# Patient Record
Sex: Female | Born: 1950 | Race: White | Hispanic: No | Marital: Married | Smoking: Never smoker
Health system: Southern US, Community
[De-identification: ages and names within clinical notes are randomized; demographics above are authoritative.]

## PROBLEM LIST (undated history)

## (undated) DIAGNOSIS — M8580 Other specified disorders of bone density and structure, unspecified site: Secondary | ICD-10-CM

## (undated) DIAGNOSIS — K589 Irritable bowel syndrome without diarrhea: Secondary | ICD-10-CM

## (undated) DIAGNOSIS — R87619 Unspecified abnormal cytological findings in specimens from cervix uteri: Secondary | ICD-10-CM

## (undated) HISTORY — DX: Irritable bowel syndrome, unspecified: K58.9

## (undated) HISTORY — DX: Unspecified abnormal cytological findings in specimens from cervix uteri: R87.619

## (undated) HISTORY — DX: Other specified disorders of bone density and structure, unspecified site: M85.80

## (undated) HISTORY — PX: BUNIONECTOMY: SHX129

## (undated) HISTORY — PX: CATARACT EXTRACTION, BILATERAL: SHX1313

---

## 1999-03-10 HISTORY — PX: THYROIDECTOMY, PARTIAL: SHX18

## 1999-06-23 ENCOUNTER — Other Ambulatory Visit: Admission: RE | Admit: 1999-06-23 | Discharge: 1999-06-23 | Payer: Self-pay | Admitting: Gynecology

## 2000-05-03 ENCOUNTER — Other Ambulatory Visit: Admission: RE | Admit: 2000-05-03 | Discharge: 2000-05-03 | Payer: Self-pay | Admitting: Gynecology

## 2000-07-16 ENCOUNTER — Encounter: Payer: Self-pay | Admitting: Internal Medicine

## 2000-07-16 ENCOUNTER — Encounter: Payer: Self-pay | Admitting: Gynecology

## 2000-07-16 ENCOUNTER — Encounter: Admission: RE | Admit: 2000-07-16 | Discharge: 2000-07-16 | Payer: Self-pay | Admitting: Gynecology

## 2000-07-16 ENCOUNTER — Ambulatory Visit (HOSPITAL_COMMUNITY): Admission: RE | Admit: 2000-07-16 | Discharge: 2000-07-16 | Payer: Self-pay | Admitting: Internal Medicine

## 2001-05-13 ENCOUNTER — Other Ambulatory Visit: Admission: RE | Admit: 2001-05-13 | Discharge: 2001-05-13 | Payer: Self-pay | Admitting: Obstetrics and Gynecology

## 2002-05-26 ENCOUNTER — Other Ambulatory Visit: Admission: RE | Admit: 2002-05-26 | Discharge: 2002-05-26 | Payer: Self-pay | Admitting: Obstetrics and Gynecology

## 2002-09-25 ENCOUNTER — Encounter: Admission: RE | Admit: 2002-09-25 | Discharge: 2002-09-25 | Payer: Self-pay | Admitting: Obstetrics and Gynecology

## 2002-09-25 ENCOUNTER — Encounter: Payer: Self-pay | Admitting: Obstetrics and Gynecology

## 2003-09-26 ENCOUNTER — Ambulatory Visit (HOSPITAL_COMMUNITY): Admission: RE | Admit: 2003-09-26 | Discharge: 2003-09-26 | Payer: Self-pay | Admitting: Obstetrics and Gynecology

## 2003-10-08 ENCOUNTER — Other Ambulatory Visit: Admission: RE | Admit: 2003-10-08 | Discharge: 2003-10-08 | Payer: Self-pay | Admitting: Obstetrics and Gynecology

## 2004-09-26 ENCOUNTER — Ambulatory Visit (HOSPITAL_COMMUNITY): Admission: RE | Admit: 2004-09-26 | Discharge: 2004-09-26 | Payer: Self-pay | Admitting: Internal Medicine

## 2005-09-28 ENCOUNTER — Ambulatory Visit (HOSPITAL_COMMUNITY): Admission: RE | Admit: 2005-09-28 | Discharge: 2005-09-28 | Payer: Self-pay | Admitting: Internal Medicine

## 2005-10-07 ENCOUNTER — Other Ambulatory Visit: Admission: RE | Admit: 2005-10-07 | Discharge: 2005-10-07 | Payer: Self-pay | Admitting: Obstetrics & Gynecology

## 2005-10-21 ENCOUNTER — Ambulatory Visit (HOSPITAL_COMMUNITY): Admission: RE | Admit: 2005-10-21 | Discharge: 2005-10-21 | Payer: Self-pay | Admitting: Obstetrics & Gynecology

## 2006-10-04 ENCOUNTER — Ambulatory Visit (HOSPITAL_COMMUNITY): Admission: RE | Admit: 2006-10-04 | Discharge: 2006-10-04 | Payer: Self-pay | Admitting: Obstetrics and Gynecology

## 2006-10-12 ENCOUNTER — Other Ambulatory Visit: Admission: RE | Admit: 2006-10-12 | Discharge: 2006-10-12 | Payer: Self-pay | Admitting: Obstetrics & Gynecology

## 2007-10-05 ENCOUNTER — Ambulatory Visit (HOSPITAL_COMMUNITY): Admission: RE | Admit: 2007-10-05 | Discharge: 2007-10-05 | Payer: Self-pay | Admitting: Internal Medicine

## 2007-10-13 ENCOUNTER — Other Ambulatory Visit: Admission: RE | Admit: 2007-10-13 | Discharge: 2007-10-13 | Payer: Self-pay | Admitting: Obstetrics and Gynecology

## 2007-10-24 ENCOUNTER — Ambulatory Visit (HOSPITAL_COMMUNITY): Admission: RE | Admit: 2007-10-24 | Discharge: 2007-10-24 | Payer: Self-pay | Admitting: Internal Medicine

## 2008-10-08 ENCOUNTER — Ambulatory Visit (HOSPITAL_COMMUNITY): Admission: RE | Admit: 2008-10-08 | Discharge: 2008-10-08 | Payer: Self-pay | Admitting: Internal Medicine

## 2009-10-10 ENCOUNTER — Ambulatory Visit (HOSPITAL_COMMUNITY): Admission: RE | Admit: 2009-10-10 | Discharge: 2009-10-10 | Payer: Self-pay | Admitting: Obstetrics and Gynecology

## 2010-09-11 ENCOUNTER — Other Ambulatory Visit (HOSPITAL_COMMUNITY): Payer: Self-pay | Admitting: Internal Medicine

## 2010-09-11 DIAGNOSIS — Z139 Encounter for screening, unspecified: Secondary | ICD-10-CM

## 2010-10-20 ENCOUNTER — Ambulatory Visit (HOSPITAL_COMMUNITY)
Admission: RE | Admit: 2010-10-20 | Discharge: 2010-10-20 | Disposition: A | Discharge status: Home / Self Care | Payer: BC Managed Care – PPO | Source: Ambulatory Visit | Attending: Internal Medicine | Admitting: Internal Medicine

## 2010-10-20 ENCOUNTER — Ambulatory Visit (HOSPITAL_COMMUNITY): Payer: Self-pay

## 2010-10-20 DIAGNOSIS — Z1231 Encounter for screening mammogram for malignant neoplasm of breast: Secondary | ICD-10-CM | POA: Insufficient documentation

## 2010-10-20 DIAGNOSIS — Z139 Encounter for screening, unspecified: Secondary | ICD-10-CM

## 2011-09-15 ENCOUNTER — Other Ambulatory Visit (HOSPITAL_COMMUNITY): Payer: Self-pay | Admitting: Internal Medicine

## 2011-09-15 DIAGNOSIS — Z139 Encounter for screening, unspecified: Secondary | ICD-10-CM

## 2011-10-22 ENCOUNTER — Ambulatory Visit (HOSPITAL_COMMUNITY)
Admission: RE | Admit: 2011-10-22 | Discharge: 2011-10-22 | Disposition: A | Discharge status: Home / Self Care | Payer: BC Managed Care – PPO | Source: Ambulatory Visit | Attending: Internal Medicine | Admitting: Internal Medicine

## 2011-10-22 DIAGNOSIS — Z1231 Encounter for screening mammogram for malignant neoplasm of breast: Secondary | ICD-10-CM | POA: Insufficient documentation

## 2011-10-22 DIAGNOSIS — Z139 Encounter for screening, unspecified: Secondary | ICD-10-CM

## 2012-09-22 ENCOUNTER — Other Ambulatory Visit (HOSPITAL_COMMUNITY): Payer: Self-pay | Admitting: Internal Medicine

## 2012-09-22 DIAGNOSIS — Z139 Encounter for screening, unspecified: Secondary | ICD-10-CM

## 2012-10-24 ENCOUNTER — Ambulatory Visit (HOSPITAL_COMMUNITY)
Admission: RE | Admit: 2012-10-24 | Discharge: 2012-10-24 | Disposition: A | Discharge status: Home / Self Care | Payer: BC Managed Care – PPO | Source: Ambulatory Visit | Attending: Internal Medicine | Admitting: Internal Medicine

## 2012-10-24 DIAGNOSIS — Z1231 Encounter for screening mammogram for malignant neoplasm of breast: Secondary | ICD-10-CM | POA: Insufficient documentation

## 2012-10-24 DIAGNOSIS — Z139 Encounter for screening, unspecified: Secondary | ICD-10-CM

## 2012-11-08 ENCOUNTER — Encounter: Payer: Self-pay | Admitting: Certified Nurse Midwife

## 2012-11-09 ENCOUNTER — Encounter: Payer: Self-pay | Admitting: Certified Nurse Midwife

## 2012-11-09 ENCOUNTER — Ambulatory Visit (INDEPENDENT_AMBULATORY_CARE_PROVIDER_SITE_OTHER): Payer: BC Managed Care – PPO | Admitting: Certified Nurse Midwife

## 2012-11-09 VITALS — BP 90/60 | HR 60 | Resp 16 | Ht 61.75 in | Wt 107.0 lb

## 2012-11-09 DIAGNOSIS — Z01419 Encounter for gynecological examination (general) (routine) without abnormal findings: Secondary | ICD-10-CM

## 2012-11-09 DIAGNOSIS — R6882 Decreased libido: Secondary | ICD-10-CM

## 2012-11-09 NOTE — Progress Notes (Signed)
62 y.o. [id number] Married Caucasian Fe here for annual exam.  Menopausal no HRT. Still having decreased libido and would like help with. Using Olive Oil for dryness, working well. Sees PCP  For aex and labs, all normal per patient. No health issues today.  Patient's last menstrual period was 01/07/1998.          Sexually active: yes  The current method of family planning is vasectomy.    Exercising: yes  walking & exercise ball Smoker:  no  Health Maintenance: Pap:  11-04-11 neg HPV HR neg MMG:  10-24-12 normal Colonoscopy:  2010 BMD:   2009 TDaP:  2006 Labs: none Self breast exam: done occ   reports that she has never smoked. She does not have any smokeless tobacco history on file. She reports that she drinks about 0.5 ounces of alcohol per week. She reports that she does not use illicit drugs.  Past Medical History  Diagnosis Date  . IBS (irritable bowel syndrome)   . Osteopenia     Past Surgical History  Procedure Laterality Date  . Thyroidectomy, partial  2001  . Bunionectomy      left & right    Current Outpatient Prescriptions  Medication Sig Dispense Refill  . Methylcellulose, Laxative, (CITRUCEL PO) Take by mouth daily.      . Multiple Vitamins-Minerals (MULTIVITAMIN PO) Take by mouth daily.       No current facility-administered medications for this visit.    Family History  Problem Relation Age of Onset  . Cancer Father     prostate  . Parkinson's disease Father   . Other Brother     blood clot    ROS:  Pertinent items are noted in HPI.  Otherwise, a comprehensive ROS was negative.  Exam:   BP 90/60  Pulse 60  Resp 16  Ht 5' 1.75" (1.568 m)  Wt 107 lb (48.535 kg)  BMI 19.74 kg/m2  LMP 01/07/1998 Height: 5' 1.75" (156.8 cm)  Ht Readings from Last 3 Encounters:  11/09/12 5' 1.75" (1.568 m)    General appearance: alert, cooperative and appears stated age Head: Normocephalic, without obvious abnormality, atraumatic Neck: no adenopathy, supple,  symmetrical, trachea midline and thyroid normal to inspection and palpation Lungs: clear to auscultation bilaterally Breasts: normal appearance, no masses or tenderness, No nipple retraction or dimpling, No nipple discharge or bleeding, No axillary or supraclavicular adenopathy Heart: regular rate and rhythm Abdomen: soft, non-tender; no masses,  no organomegaly Extremities: extremities normal, atraumatic, no cyanosis or edema Skin: Skin color, texture, turgor normal. No rashes or lesions Lymph nodes: Cervical, supraclavicular, and axillary nodes normal. No abnormal inguinal nodes palpated Neurologic: Grossly normal   Pelvic: External genitalia:  no lesions              Urethra:  normal appearing urethra with no masses, tenderness or lesions              Bartholin's and Skene's: normal                 Vagina: normal appearing vagina with normal color and discharge, no lesions              Cervix: normal, non tender              Pap taken: no Bimanual Exam:  Uterus:  normal size, contour, position, consistency, mobility, non-tender and anteverted              Adnexa: normal adnexa and no mass,  fullness, tenderness               Rectovaginal: Confirms               Anus:  normal sphincter tone, no lesions  A:  Well Woman with normal exam  Menopausal no HRT  Decreased Libido  P:   Reviewed health and wellness pertinent to exam  Aware of need to evaluate if vaginal bleeding  Lab Testosterone total, Discussed date night, time for self.  Pap smear as per guidelines   Mammogram yearly pap smear not taken today  counseled on breast self exam, mammography screening, adequate intake of calcium and vitamin D, diet and exercise, Kegel's exercises  return annually or prn  An After Visit Summary was printed and given to the patient.

## 2012-11-09 NOTE — Patient Instructions (Addendum)

## 2012-11-10 LAB — TESTOSTERONE, FREE, TOTAL, SHBG
Testosterone, Free: 3.8 pg/mL (ref 0.6–6.8)
Testosterone: 46 ng/dL (ref 10–70)

## 2012-11-14 NOTE — Progress Notes (Signed)
Note reviewed, agree with plan.  Trudi Morgenthaler, MD  

## 2013-01-17 ENCOUNTER — Encounter: Payer: Self-pay | Admitting: Certified Nurse Midwife

## 2013-10-06 ENCOUNTER — Other Ambulatory Visit (HOSPITAL_COMMUNITY): Payer: Self-pay | Admitting: Internal Medicine

## 2013-10-06 DIAGNOSIS — Z139 Encounter for screening, unspecified: Secondary | ICD-10-CM

## 2013-10-16 ENCOUNTER — Other Ambulatory Visit (HOSPITAL_COMMUNITY): Payer: Self-pay | Admitting: Internal Medicine

## 2013-10-16 DIAGNOSIS — M899 Disorder of bone, unspecified: Secondary | ICD-10-CM

## 2013-10-16 DIAGNOSIS — M949 Disorder of cartilage, unspecified: Principal | ICD-10-CM

## 2013-10-19 ENCOUNTER — Ambulatory Visit (HOSPITAL_COMMUNITY)
Admission: RE | Admit: 2013-10-19 | Discharge: 2013-10-19 | Disposition: A | Discharge status: Home / Self Care | Payer: BC Managed Care – PPO | Source: Ambulatory Visit | Attending: Internal Medicine | Admitting: Internal Medicine

## 2013-10-19 DIAGNOSIS — M949 Disorder of cartilage, unspecified: Principal | ICD-10-CM

## 2013-10-19 DIAGNOSIS — M899 Disorder of bone, unspecified: Secondary | ICD-10-CM | POA: Diagnosis not present

## 2013-10-30 ENCOUNTER — Ambulatory Visit (HOSPITAL_COMMUNITY)
Admission: RE | Admit: 2013-10-30 | Discharge: 2013-10-30 | Disposition: A | Discharge status: Home / Self Care | Payer: BC Managed Care – PPO | Source: Ambulatory Visit | Attending: Internal Medicine | Admitting: Internal Medicine

## 2013-10-30 DIAGNOSIS — Z1231 Encounter for screening mammogram for malignant neoplasm of breast: Secondary | ICD-10-CM | POA: Insufficient documentation

## 2013-10-30 DIAGNOSIS — Z139 Encounter for screening, unspecified: Secondary | ICD-10-CM

## 2013-11-14 ENCOUNTER — Ambulatory Visit: Payer: BC Managed Care – PPO | Admitting: Certified Nurse Midwife

## 2013-11-15 ENCOUNTER — Ambulatory Visit: Payer: BC Managed Care – PPO | Admitting: Certified Nurse Midwife

## 2013-11-16 ENCOUNTER — Ambulatory Visit: Payer: BC Managed Care – PPO | Admitting: Certified Nurse Midwife

## 2013-11-22 ENCOUNTER — Ambulatory Visit (INDEPENDENT_AMBULATORY_CARE_PROVIDER_SITE_OTHER): Payer: BC Managed Care – PPO | Admitting: Certified Nurse Midwife

## 2013-11-22 ENCOUNTER — Encounter: Payer: Self-pay | Admitting: Certified Nurse Midwife

## 2013-11-22 VITALS — BP 110/62 | HR 76 | Resp 16 | Ht 61.75 in | Wt 105.0 lb

## 2013-11-22 DIAGNOSIS — Z23 Encounter for immunization: Secondary | ICD-10-CM

## 2013-11-22 DIAGNOSIS — Z124 Encounter for screening for malignant neoplasm of cervix: Secondary | ICD-10-CM

## 2013-11-22 DIAGNOSIS — Z01419 Encounter for gynecological examination (general) (routine) without abnormal findings: Secondary | ICD-10-CM

## 2013-11-22 NOTE — Progress Notes (Signed)
63 y.o. [id number] Married Caucasian Fe here for annual exam. Menopausal no HRT. Denies vaginal bleeding . Vaginal dryness better with Olive oil use. Sees PCP for aex and labs. New grandchild due in 1/16. No other health issues today.  Patient's last menstrual period was 01/07/1998.          Sexually active: Yes.    The current method of family planning is vasectomy.    Exercising: Yes.    walking Smoker:  no  Health Maintenance: Pap:  11-04-11 neg HPV HV neg MMG:  10-30-13 density category d, birads category 1:neg Colonoscopy:  2010 BMD:   2015 TDaP:  2006 Labs: none Self breast exam: done occ   reports that she has never smoked. She does not have any smokeless tobacco history on file. She reports that she drinks about .5 ounces of alcohol per week. She reports that she does not use illicit drugs.  Past Medical History  Diagnosis Date  . IBS (irritable bowel syndrome)   . Osteopenia     Past Surgical History  Procedure Laterality Date  . Thyroidectomy, partial  2001  . Bunionectomy      left & right    Current Outpatient Prescriptions  Medication Sig Dispense Refill  . Methylcellulose, Laxative, (CITRUCEL PO) Take by mouth daily.      . Multiple Vitamins-Minerals (MULTIVITAMIN PO) Take by mouth daily.       No current facility-administered medications for this visit.    Family History  Problem Relation Age of Onset  . Cancer Father     prostate  . Parkinson's disease Father   . Other Brother     blood clot    ROS:  Pertinent items are noted in HPI.  Otherwise, a comprehensive ROS was negative.  Exam:   BP 110/62  Pulse 76  Resp 16  Ht 5' 1.75" (1.568 m)  Wt 105 lb (47.628 kg)  BMI 19.37 kg/m2  LMP 01/07/1998 Height: 5' 1.75" (156.8 cm)  Ht Readings from Last 3 Encounters:  11/22/13 5' 1.75" (1.568 m)  11/09/12 5' 1.75" (1.568 m)    General appearance: alert, cooperative and appears stated age Head: Normocephalic, without obvious abnormality,  atraumatic Neck: no adenopathy, supple, symmetrical, trachea midline and thyroid normal to inspection and palpation Lungs: clear to auscultation bilaterally Breasts: normal appearance, no masses or tenderness, No nipple retraction or dimpling, No nipple discharge or bleeding, No axillary or supraclavicular adenopathy Heart: regular rate and rhythm Abdomen: soft, non-tender; no masses,  no organomegaly Extremities: extremities normal, atraumatic, no cyanosis or edema Skin: Skin color, texture, turgor normal. No rashes or lesions Lymph nodes: Cervical, supraclavicular, and axillary nodes normal. No abnormal inguinal nodes palpated Neurologic: Grossly normal   Pelvic: External genitalia:  no lesions              Urethra:  normal appearing urethra with no masses, tenderness or lesions              Bartholin's and Skene's: normal                 Vagina: normal appearing vagina with normal color and discharge, no lesions              Cervix: normal, non tender, no masses              Pap taken: Yes.   Bimanual Exam:  Uterus:  normal size, contour, position, consistency, mobility, non-tender and anteverted  Adnexa: normal adnexa and no mass, fullness, tenderness               Rectovaginal: Confirms               Anus:  normal sphincter tone, no lesions  A:  Well Woman with normal exam  Menopausal no HRT  Immunization update  P:   Reviewed health and wellness pertinent to exam  Aware of need to evaluate if vaginal bleeding  Requests TDAP  Pap smear taken today with HPV reflex   counseled on breast self exam, mammography screening, adequate intake of calcium and vitamin D, diet and exercise  return annually or prn  An After Visit Summary was printed and given to the patient.

## 2013-11-22 NOTE — Patient Instructions (Signed)
EXERCISE AND DIET:  We recommended that you start or continue a regular exercise program for good health. Regular exercise means any activity that makes your heart beat faster and makes you sweat.  We recommend exercising at least 30 minutes per day at least 3 days a week, preferably 4 or 5.  We also recommend a diet low in fat and sugar.  Inactivity, poor dietary choices and obesity can cause diabetes, heart attack, stroke, and kidney damage, among others.    ALCOHOL AND SMOKING:  Women should limit their alcohol intake to no more than 7 drinks/beers/glasses of wine (combined, not each!) per week. Moderation of alcohol intake to this level decreases your risk of breast cancer and liver damage. And of course, no recreational drugs are part of a healthy lifestyle.  And absolutely no smoking or even second hand smoke. Most people know smoking can cause heart and lung diseases, but did you know it also contributes to weakening of your bones? Aging of your skin?  Yellowing of your teeth and nails?  CALCIUM AND VITAMIN D:  Adequate intake of calcium and Vitamin D are recommended.  The recommendations for exact amounts of these supplements seem to change often, but generally speaking 600 mg of calcium (either carbonate or citrate) and 800 units of Vitamin D per day seems prudent. Certain women may benefit from higher intake of Vitamin D.  If you are among these women, your doctor will have told you during your visit.   Calcium Citrate is better.  PAP SMEARS:  Pap smears, to check for cervical cancer or precancers,  have traditionally been done yearly, although recent scientific advances have shown that most women can have pap smears less often.  However, every woman still should have a physical exam from her gynecologist every year. It will include a breast check, inspection of the vulva and vagina to check for abnormal growths or skin changes, a visual exam of the cervix, and then an exam to evaluate the size and  shape of the uterus and ovaries.  And after 61 years of age, a rectal exam is indicated to check for rectal cancers. We will also provide age appropriate advice regarding health maintenance, like when you should have certain vaccines, screening for sexually transmitted diseases, bone density testing, colonoscopy, mammograms, etc.   MAMMOGRAMS:  All women over 45 years old should have a yearly mammogram. Many facilities now offer a "3D" mammogram, which may cost around $50 extra out of pocket. If possible,  we recommend you accept the option to have the 3D mammogram performed.  It both reduces the number of women who will be called back for extra views which then turn out to be normal, and it is better than the routine mammogram at detecting truly abnormal areas.    COLONOSCOPY:  Colonoscopy to screen for colon cancer is recommended for all women at age 24.  We know, you hate the idea of the prep.  We agree, BUT, having colon cancer and not knowing it is worse!!  Colon cancer so often starts as a polyp that can be seen and removed at colonscopy, which can quite literally save your life!  And if your first colonoscopy is normal and you have no family history of colon cancer, most women don't have to have it again for 10 years.  Once every ten years, you can do something that may end up saving your life, right?  We will be happy to help you get it scheduled  when you are ready.  Be sure to check your insurance coverage so you understand how much it will cost.  It may be covered as a preventative service at no cost, but you should check your particular policy.     Calcium  This is a blood test to evaluate the parathyroid function and the bone metabolism to help monitor patients with hyperparathyroidism, kidney failure, kidney transplantation, and various malignancies (cancerous tumors). It is also used to monitor calcium levels following large blood transfusions. PREPARATION FOR TEST No preparation or fasting  is necessary unless fasting would be required for any other part of a test being done the same day. NORMAL FINDINGS Total calcium by age  Less than 10 days  mg/dl: 1.6-10.9  mmol/L: 6.0-4.5  Umbilical  mg/dl: 4-09.8  mmol/L: 1.19-1.47  10 days-2 years  mg/dl: 8-29.5  mmol/L: 6.2-1.30  Child  mg/dl: 8.6-57.8  mmol/L: 4.6-9.6  Adult  mg/dl: 2-95.2  mmol/L: 8.41-3.24 Ionized calcium by age  Newborn  mg/dl: 4.01-0.27  mmol/L: 2.53-6.64  2 months-18 years  mg/dl: 4.03-4.74  mmol/L: 2.59-5.63  Adult  mg/dl: 8.7-5.6  mmol/L: 4.33-2.95 Ranges for normal findings may vary among different laboratories and hospitals. You should always check with your doctor after having lab work or other tests done to discuss the meaning of your test results and whether your values are considered within normal limits. MEANING OF TEST  Your caregiver will go over the test results with you and discuss the importance and meaning of your results, as well as treatment options and the need for additional tests if necessary. OBTAINING THE TEST RESULTS It is your responsibility to obtain your test results. Ask the lab when and how you will get your results. Document Released: 03/17/2004 Document Revised: 05/18/2011 Document Reviewed: 02/01/2008 [facility] Patient Information 2015 [place], [place]. This information is not intended to replace advice given to you by your health care provider. Make sure you discuss any questions you have with your health care provider.

## 2013-11-24 LAB — IPS PAP TEST WITH REFLEX TO HPV

## 2013-11-27 NOTE — Progress Notes (Signed)
Reviewed personally.  M. Suzanne Synia Douglass, MD.  

## 2014-01-08 ENCOUNTER — Encounter: Payer: Self-pay | Admitting: Certified Nurse Midwife

## 2014-09-28 ENCOUNTER — Other Ambulatory Visit (HOSPITAL_COMMUNITY): Payer: Self-pay | Admitting: Internal Medicine

## 2014-09-28 DIAGNOSIS — Z1231 Encounter for screening mammogram for malignant neoplasm of breast: Secondary | ICD-10-CM

## 2014-11-05 ENCOUNTER — Ambulatory Visit (HOSPITAL_COMMUNITY)
Admission: RE | Admit: 2014-11-05 | Discharge: 2014-11-05 | Disposition: A | Discharge status: Home / Self Care | Payer: BC Managed Care – PPO | Source: Ambulatory Visit | Attending: Internal Medicine | Admitting: Internal Medicine

## 2014-11-05 DIAGNOSIS — Z1231 Encounter for screening mammogram for malignant neoplasm of breast: Secondary | ICD-10-CM | POA: Diagnosis present

## 2014-11-27 ENCOUNTER — Ambulatory Visit: Payer: BC Managed Care – PPO | Admitting: Certified Nurse Midwife

## 2014-11-29 ENCOUNTER — Ambulatory Visit (INDEPENDENT_AMBULATORY_CARE_PROVIDER_SITE_OTHER): Payer: BC Managed Care – PPO | Admitting: Certified Nurse Midwife

## 2014-11-29 ENCOUNTER — Encounter: Payer: Self-pay | Admitting: Certified Nurse Midwife

## 2014-11-29 VITALS — BP 114/62 | HR 76 | Resp 20 | Ht 61.5 in | Wt 106.0 lb

## 2014-11-29 DIAGNOSIS — Z01419 Encounter for gynecological examination (general) (routine) without abnormal findings: Secondary | ICD-10-CM | POA: Diagnosis not present

## 2014-11-29 NOTE — Progress Notes (Signed)
64 y.o. [id number] Married  Caucasian Fe here for annual exam. Menopausal no HRT. Denies vaginal bleeding. Vaginal dryness improved with Olive Oil. Sees PCP for aex and labs. Took Shingles vaccine this year. No health issues today.  Patient's last menstrual period was 01/07/1998.          Sexually active: Yes.    The current method of family planning is vasectomy.    Exercising: Yes.    walking, stretching Smoker:  no  Health Maintenance: Pap:  11-22-13 neg MMG: 11-05-14 category d density,birads 1:neg Colonoscopy:  2010 10 years BMD:   2015 TDaP:  2015 Labs: pcp Self breast exam: done occ   reports that she has never smoked. She does not have any smokeless tobacco history on file. She reports that she drinks about 0.5 oz of alcohol per week. She reports that she does not use illicit drugs.  Past Medical History  Diagnosis Date  . IBS (irritable bowel syndrome)   . Osteopenia     Past Surgical History  Procedure Laterality Date  . Thyroidectomy, partial  2001  . Bunionectomy      left & right    Current Outpatient Prescriptions  Medication Sig Dispense Refill  . Methylcellulose, Laxative, (CITRUCEL PO) Take by mouth daily.    . Multiple Vitamins-Minerals (MULTIVITAMIN PO) Take by mouth daily.     No current facility-administered medications for this visit.    Family History  Problem Relation Age of Onset  . Cancer Father     prostate  . Parkinson's disease Father   . Other Brother     blood clot    ROS:  Pertinent items are noted in HPI.  Otherwise, a comprehensive ROS was negative.  Exam:   BP 114/62 mmHg  Pulse 76  Resp 20  Ht 5' 1.5" (1.562 m)  Wt 106 lb (48.081 kg)  BMI 19.71 kg/m2  LMP 01/07/1998 Height: 5' 1.5" (156.2 cm) Ht Readings from Last 3 Encounters:  11/29/14 5' 1.5" (1.562 m)  11/22/13 5' 1.75" (1.568 m)  11/09/12 5' 1.75" (1.568 m)    General appearance: alert, cooperative and appears stated age Head: Normocephalic, without obvious  abnormality, atraumatic Neck: no adenopathy, supple, symmetrical, trachea midline and thyroid normal to inspection and palpation Lungs: clear to auscultation bilaterally Breasts: normal appearance, no masses or tenderness, No nipple retraction or dimpling, No nipple discharge or bleeding, No axillary or supraclavicular adenopathy Heart: regular rate and rhythm Abdomen: soft, non-tender; no masses,  no organomegaly Extremities: extremities normal, atraumatic, no cyanosis or edema Skin: Skin color, texture, turgor normal. No rashes or lesions Lymph nodes: Cervical, supraclavicular, and axillary nodes normal. No abnormal inguinal nodes palpated Neurologic: Grossly normal   Pelvic: External genitalia:  no lesions              Urethra:  normal appearing urethra with no masses, tenderness or lesions              Bartholin's and Skene's: normal                 Vagina: normal appearing vagina with normal color and discharge, no lesions              Cervix: normal,non tender, no lesions              Pap taken: No. Bimanual Exam:  Uterus:  normal size, contour, position, consistency, mobility, non-tender              Adnexa: normal adnexa  and no mass, fullness, tenderness               Rectovaginal: Confirms               Anus:  normal sphincter tone, no lesions  Chaperone present: yes  A:  Well Woman with normal exam  Menopausal no HRT  Atrophic Vaginitis responding to daily Olive oil  P:   Reviewed health and wellness pertinent to exam  Aware of need to evaluate if vaginal bleeding  Discussed Coconut Oil trial if interested.  Pap smear as above not taken   counseled on breast self exam, mammography screening, adequate intake of calcium and vitamin D, diet and exercise  return annually or prn  An After Visit Summary was printed and given to the patient.

## 2014-11-29 NOTE — Patient Instructions (Signed)

## 2014-11-29 NOTE — Progress Notes (Signed)
Reviewed personally.  M. Suzanne Miller, MD.  

## 2015-09-30 ENCOUNTER — Other Ambulatory Visit (HOSPITAL_COMMUNITY): Payer: Self-pay | Admitting: Internal Medicine

## 2015-09-30 DIAGNOSIS — Z1231 Encounter for screening mammogram for malignant neoplasm of breast: Secondary | ICD-10-CM

## 2015-11-06 ENCOUNTER — Ambulatory Visit (HOSPITAL_COMMUNITY): Payer: BC Managed Care – PPO

## 2015-11-06 ENCOUNTER — Other Ambulatory Visit (HOSPITAL_COMMUNITY): Payer: Self-pay | Admitting: Internal Medicine

## 2015-11-06 DIAGNOSIS — Z1231 Encounter for screening mammogram for malignant neoplasm of breast: Secondary | ICD-10-CM

## 2015-11-18 ENCOUNTER — Ambulatory Visit (HOSPITAL_COMMUNITY)
Admission: RE | Admit: 2015-11-18 | Discharge: 2015-11-18 | Disposition: A | Discharge status: Home / Self Care | Payer: BC Managed Care – PPO | Source: Ambulatory Visit | Attending: Internal Medicine | Admitting: Internal Medicine

## 2015-11-18 ENCOUNTER — Ambulatory Visit (HOSPITAL_BASED_OUTPATIENT_CLINIC_OR_DEPARTMENT_OTHER): Payer: BC Managed Care – PPO

## 2015-11-18 DIAGNOSIS — Z1231 Encounter for screening mammogram for malignant neoplasm of breast: Secondary | ICD-10-CM | POA: Diagnosis present

## 2015-12-03 ENCOUNTER — Encounter: Payer: Self-pay | Admitting: Certified Nurse Midwife

## 2015-12-03 ENCOUNTER — Ambulatory Visit (INDEPENDENT_AMBULATORY_CARE_PROVIDER_SITE_OTHER): Payer: BC Managed Care – PPO | Admitting: Certified Nurse Midwife

## 2015-12-03 VITALS — BP 116/68 | HR 68 | Resp 16 | Ht 61.25 in | Wt 109.0 lb

## 2015-12-03 DIAGNOSIS — N952 Postmenopausal atrophic vaginitis: Secondary | ICD-10-CM

## 2015-12-03 DIAGNOSIS — Z01419 Encounter for gynecological examination (general) (routine) without abnormal findings: Secondary | ICD-10-CM | POA: Diagnosis not present

## 2015-12-03 DIAGNOSIS — Z124 Encounter for screening for malignant neoplasm of cervix: Secondary | ICD-10-CM | POA: Diagnosis not present

## 2015-12-03 NOTE — Progress Notes (Signed)
65 y.o. [id number] Married  Caucasian Fe here for annual exam. Menopausal no HRT. Denies vaginal bleeding. Treating with coconut oil working well.  Sees Dr. Ouida SillsFagan yearly for labs and aex. Having some right elbow pain and under PT now, which is helping. No health issues today.  Patient's last menstrual period was 01/07/1998.          Sexually active: Yes.    The current method of family planning is vasectomy.    Exercising: Yes.    walking, running, bike, eliptical Smoker:  no  Health Maintenance: Pap:  11-22-13 neg MMG:  11-18-15 category d density birads 1:neg 3D Colonoscopy:  2010 f/u 4758yrs BMD:   2015 osteopenia, PCP manages TDaP:  2015 Shingles: no Pneumonia: no Hep C and HIV: declines Labs: pcp Self breast exam: done occ   reports that she has never smoked. She has never used smokeless tobacco. She reports that she drinks about 0.5 oz of alcohol per week . She reports that she does not use drugs.  Past Medical History:  Diagnosis Date  . IBS (irritable bowel syndrome)   . Osteopenia     Past Surgical History:  Procedure Laterality Date  . BUNIONECTOMY     left & right  . THYROIDECTOMY, PARTIAL  2001    Current Outpatient Prescriptions  Medication Sig Dispense Refill  . Methylcellulose, Laxative, (CITRUCEL PO) Take by mouth daily.    . Multiple Vitamins-Minerals (MULTIVITAMIN PO) Take by mouth daily.     No current facility-administered medications for this visit.     Family History  Problem Relation Age of Onset  . Cancer Father     prostate  . Parkinson's disease Father   . Other Brother     blood clot    ROS:  Pertinent items are noted in HPI.  Otherwise, a comprehensive ROS was negative.  Exam:   BP 116/68   Pulse 68   Resp 16   Ht 5' 1.25" (1.556 m)   Wt 109 lb (49.4 kg)   LMP 01/07/1998   BMI 20.43 kg/m  Height: 5' 1.25" (155.6 cm) Ht Readings from Last 3 Encounters:  12/03/15 5' 1.25" (1.556 m)  11/29/14 5' 1.5" (1.562 m)  11/22/13 5' 1.75"  (1.568 m)    General appearance: alert, cooperative and appears stated age Head: Normocephalic, without obvious abnormality, atraumatic Neck: no adenopathy, supple, symmetrical, trachea midline and thyroid normal to inspection and palpation Lungs: clear to auscultation bilaterally Breasts: normal appearance, no masses or tenderness, No nipple retraction or dimpling, No nipple discharge or bleeding, No axillary or supraclavicular adenopathy Heart: regular rate and rhythm Abdomen: soft, non-tender; no masses,  no organomegaly Extremities: extremities normal, atraumatic, no cyanosis or edema Skin: Skin color, texture, turgor normal. No rashes or lesions Lymph nodes: Cervical, supraclavicular, and axillary nodes normal. No abnormal inguinal nodes palpated Neurologic: Grossly normal   Pelvic: External genitalia:  no lesions              Urethra:  normal appearing urethra with no masses, tenderness or lesions              Bartholin's and Skene's: normal                 Vagina: atrophic appearing vagina with normal color and discharge, no lesions, scant moisture              Cervix: no cervical motion tenderness and no lesions  Pap taken: Yes.   Bimanual Exam:  Uterus:  normal size, contour, position, consistency, mobility, non-tender              Adnexa: normal adnexa and no mass, fullness, tenderness               Rectovaginal: Confirms               Anus:  normal sphincter tone, no lesions  Chaperone present: yes  A:  Well Woman with normal exam  Menopausal no HRT  Atrophic vaginitis, using coconut oil with good results      P:   Reviewed health and wellness pertinent to exam  Aware of need to advise if vaginal bleeding  Increase usage of coconut oil to nightly and with sexual activity  Pap smear as above with HPVHR   counseled on breast self exam, mammography screening, adequate intake of calcium and vitamin D, diet and exercise, Kegel's exercises  return annually  or prn  An After Visit Summary was printed and given to the patient.

## 2015-12-03 NOTE — Patient Instructions (Signed)

## 2015-12-06 NOTE — Progress Notes (Signed)
Encounter reviewed Neira Bentsen, MD   

## 2015-12-09 LAB — IPS PAP TEST WITH HPV

## 2015-12-10 LAB — IPS HPV GENOTYPING 16/18

## 2015-12-10 NOTE — Addendum Note (Signed)
Addended by: Verner CholLEONARD, Azyiah S on: 12/10/2015 07:49 AM   Modules accepted: Orders

## 2015-12-19 ENCOUNTER — Telehealth: Payer: Self-pay

## 2015-12-19 NOTE — Telephone Encounter (Signed)
Spoke with patient. Advised of message as seen below from PepsiCoDeborah Leonard CNM. Patient is agreeable and verbalizes understanding. 08 recall placed. Next aex  12/03/2016.  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 Patient agreeable to disposition. Will close encounter.

## 2016-10-09 ENCOUNTER — Other Ambulatory Visit (HOSPITAL_COMMUNITY): Payer: Self-pay | Admitting: Internal Medicine

## 2016-10-09 DIAGNOSIS — Z1231 Encounter for screening mammogram for malignant neoplasm of breast: Secondary | ICD-10-CM

## 2016-11-23 ENCOUNTER — Ambulatory Visit (HOSPITAL_COMMUNITY)
Admission: RE | Admit: 2016-11-23 | Discharge: 2016-11-23 | Disposition: A | Discharge status: Home / Self Care | Payer: BC Managed Care – PPO | Source: Ambulatory Visit | Attending: Internal Medicine | Admitting: Internal Medicine

## 2016-11-23 DIAGNOSIS — Z1231 Encounter for screening mammogram for malignant neoplasm of breast: Secondary | ICD-10-CM | POA: Insufficient documentation

## 2016-11-24 ENCOUNTER — Telehealth: Payer: Self-pay

## 2016-11-24 NOTE — Telephone Encounter (Signed)
Patient notified of results. See lab 

## 2016-11-24 NOTE — Telephone Encounter (Signed)
Left message to call back  

## 2016-11-24 NOTE — Telephone Encounter (Signed)
Patient is returning a call to Joy. °

## 2016-11-24 NOTE — Telephone Encounter (Signed)
-----   Message from Verner Chol, CNM sent at 11/23/2016  8:43 PM EDT ----- Mammogram reviewed negative Birads 1 Density is D Recommend 3 D mammogram yearly Please let patient know recommendations due to density D

## 2016-12-03 ENCOUNTER — Ambulatory Visit (INDEPENDENT_AMBULATORY_CARE_PROVIDER_SITE_OTHER): Payer: BC Managed Care – PPO | Admitting: Certified Nurse Midwife

## 2016-12-03 ENCOUNTER — Other Ambulatory Visit (HOSPITAL_COMMUNITY)
Admission: RE | Admit: 2016-12-03 | Discharge: 2016-12-03 | Disposition: A | Discharge status: Home / Self Care | Payer: BC Managed Care – PPO | Source: Ambulatory Visit | Attending: Certified Nurse Midwife | Admitting: Certified Nurse Midwife

## 2016-12-03 ENCOUNTER — Encounter: Payer: Self-pay | Admitting: Certified Nurse Midwife

## 2016-12-03 VITALS — BP 98/60 | HR 70 | Resp 16 | Ht 61.5 in | Wt 108.0 lb

## 2016-12-03 DIAGNOSIS — Z124 Encounter for screening for malignant neoplasm of cervix: Secondary | ICD-10-CM | POA: Insufficient documentation

## 2016-12-03 DIAGNOSIS — Z87898 Personal history of other specified conditions: Secondary | ICD-10-CM | POA: Diagnosis not present

## 2016-12-03 DIAGNOSIS — Z01419 Encounter for gynecological examination (general) (routine) without abnormal findings: Secondary | ICD-10-CM | POA: Diagnosis not present

## 2016-12-03 DIAGNOSIS — Z8742 Personal history of other diseases of the female genital tract: Secondary | ICD-10-CM

## 2016-12-03 DIAGNOSIS — Z780 Asymptomatic menopausal state: Secondary | ICD-10-CM | POA: Diagnosis not present

## 2016-12-03 NOTE — Progress Notes (Signed)
66 y.o. Angelica Hicks Married  Caucasian Fe here for annual exam. Menopausal no HRT. Denies vaginal bleeding or vaginal dryness. Sees PCP for labs and aex. Follows her  Hyperparathyroid labs and calcium also. Busy with having daughter and spouse and grandchildren living with them due selling  House in [place]. "All safe". No other health issues today.    Patient's last menstrual period was 01/07/1998.          Sexually active: Yes.    The current method of family planning is vasectomy.    Exercising: Yes.    walking Smoker:  no  Health Maintenance: Pap:  11-22-13 neg, 12-03-15 neg HPV HR +, 16/18 neg History of Abnormal Pap: no MMG:  11-23-16 category d density birads 1:neg Self Breast exams: occ Colonoscopy:  2010 f/u 29yrs BMD:   2015 osteopenia pcp manages TDaP:  2015 Shingles: had done Pneumonia: not done Hep C and HIV: not done Labs: no   reports that she has never smoked. She has never used smokeless tobacco. She reports that she drinks about 0.5 oz of alcohol per week . She reports that she does not use drugs.  Past Medical History:  Diagnosis Date  . IBS (irritable bowel syndrome)   . Osteopenia     Past Surgical History:  Procedure Laterality Date  . BUNIONECTOMY     left & right  . THYROIDECTOMY, PARTIAL  2001    Current Outpatient Prescriptions  Medication Sig Dispense Refill  . Methylcellulose, Laxative, (CITRUCEL PO) Take by mouth daily.    . Multiple Vitamins-Minerals (MULTIVITAMIN PO) Take by mouth daily.     No current facility-administered medications for this visit.     Family History  Problem Relation Age of Onset  . Cancer Father        prostate  . Parkinson's disease Father   . Other Brother        blood clot    ROS:  Pertinent items are noted in HPI.  Otherwise, a comprehensive ROS was negative.  Exam:   BP 98/60   Pulse 70   Resp 16   Ht 5' 1.5" (1.562 m)   Wt 108 lb (49 kg)   LMP 01/07/1998   BMI 20.08 kg/m  Height: 5' 1.5" (156.2  cm) Ht Readings from Last 3 Encounters:  12/03/16 5' 1.5" (1.562 m)  12/03/15 5' 1.25" (1.556 m)  11/29/14 5' 1.5" (1.562 m)    General appearance: alert, cooperative and appears stated age Head: Normocephalic, without obvious abnormality, atraumatic Neck: no adenopathy, supple, symmetrical, trachea midline and thyroid normal to inspection and palpation Lungs: clear to auscultation bilaterally Breasts: normal appearance, no masses or tenderness, No nipple retraction or dimpling, No nipple discharge or bleeding, No axillary or supraclavicular adenopathy Heart: regular rate and rhythm Abdomen: soft, non-tender; no masses,  no organomegaly Extremities: extremities normal, atraumatic, no cyanosis or edema Skin: Skin color, texture, turgor normal. No rashes or lesions Lymph nodes: Cervical, supraclavicular, and axillary nodes normal. No abnormal inguinal nodes palpated Neurologic: Grossly normal   Pelvic: External genitalia:  no lesions              Urethra:  normal appearing urethra with no masses, tenderness or lesions              Bartholin's and Skene's: normal                 Vagina: normal appearing vagina with normal color and discharge, no lesions  Cervix: no bleeding following Pap, no cervical motion tenderness and no lesions              Pap taken: Yes.   Bimanual Exam:  Uterus:  normal size, contour, position, consistency, mobility, non-tender              Adnexa: normal adnexa and no mass, fullness, tenderness               Rectovaginal: Confirms               Anus:  normal sphincter tone, no lesions  Chaperone present: yes  A:  Well Woman with normal exam  Menopausal no HRT  Abnormal Pap smear follow up with + HPVHR, 16,18 negative  Hyperparathyroid history with PCP follow up  P:   Reviewed health and wellness pertinent to exam  Aware of need to evaluate if vaginal bleeding  Continue follow up  with MD as indicated.  Pap smear: yes, if normal repeat one  year, if not per results   counseled on breast self exam, mammography screening, feminine hygiene, adequate intake of calcium and vitamin D, diet and exercise  return annually or prn  An After Visit Summary was printed and given to the patient.

## 2016-12-03 NOTE — Patient Instructions (Signed)

## 2016-12-08 LAB — CYTOLOGY - PAP: HPV: NOT DETECTED

## 2016-12-11 ENCOUNTER — Telehealth: Payer: Self-pay | Admitting: Certified Nurse Midwife

## 2016-12-11 NOTE — Telephone Encounter (Signed)
Patient says she think Angelica Hicks may have called her yesterday about results. No message from yesterday.

## 2016-12-11 NOTE — Telephone Encounter (Signed)
Spoke with patient, advised as seen below per Leota Sauers, CNM. Patient verbalizes understanding and is agreeable. Will close encounter.   Notes recorded by Eliezer Bottom, CMA on 12/09/2016 at 8:22 AM EDT Message giving results left on patients voicemail. Release signed [phone] (Mobile) *Preferred*. aex scheduled for 2019 & 08 recall in ------  Notes recorded by Verner Chol, CNM on 12/08/2016 at 10:40 PM EDT Notify patient that Pap smear negative, HPVHR not detected this time, will repeat in one year Atrophy noted will need 08 due to previous abnormal

## 2016-12-15 ENCOUNTER — Ambulatory Visit: Payer: BC Managed Care – PPO | Admitting: Certified Nurse Midwife

## 2016-12-17 ENCOUNTER — Ambulatory Visit (INDEPENDENT_AMBULATORY_CARE_PROVIDER_SITE_OTHER): Payer: BC Managed Care – PPO | Admitting: Certified Nurse Midwife

## 2016-12-17 ENCOUNTER — Encounter: Payer: Self-pay | Admitting: Certified Nurse Midwife

## 2016-12-17 VITALS — BP 110/70 | HR 68 | Resp 16 | Ht 61.5 in | Wt 107.0 lb

## 2016-12-17 DIAGNOSIS — N898 Other specified noninflammatory disorders of vagina: Secondary | ICD-10-CM | POA: Diagnosis not present

## 2016-12-17 DIAGNOSIS — B853 Phthiriasis: Secondary | ICD-10-CM | POA: Diagnosis not present

## 2016-12-17 NOTE — Patient Instructions (Signed)
Lice, Adult Lice are tiny insects with claws on the ends of their legs. They are small parasites that live on the human body. A parasite is an insect that lives off another animal and cannot survive without it. Lice often make their home in a person's hair, such as hair on the head or in the pubic area. Pubic lice are sometimes referred to as crabs. Lice hatch from little round eggs, which are attached to the base of hairs. Lice eggs are also called nits. Lice can spread from one person to another. Lice crawl. They do not fly or jump. Lice cause skin irritation and itching in the area of the infested hair. Although having lice can be annoying, it is not dangerous. Lice do not spread diseases. Treatment will usually clear up the symptoms within a few days. What are the causes? This condition may be caused by:  Having very close contact with an infested person.  Sharing infested items that touch your skin and hair. These include personal items, such as hats, combs, brushes, towels, clothing, pillowcases, or sheets.  Pubic lice are spread through sexual contact. What increases the risk? Although having lice is more common among young children, anyone can get lice. Lice tend to thrive in warm weather, so that type of weather increases the risk. What are the signs or symptoms? Symptoms of this condition include:  Itchiness in the affected area.  Skin irritation.  Feeling of something moving in the hair.  Rash or sores on the skin.  Tiny flakes or sacs near the scalp. These may be white, yellow, or tan.  Tiny bugs crawling on the hair or scalp.  How is this diagnosed? This condition is diagnosed based on:  Your symptoms.  A physical exam. ? Your health care provider will examine the affected area closely for live lice, tiny eggs (nits), and empty egg cases. ? Eggs are typically yellow or tan in color. Empty egg cases are whitish. Lice are gray or brown.  How is this treated? Treatment  for this condition includes:  Using a hair rinse that contains a mild insecticide to kill lice. Your health care provider will recommend a prescription or over-the-counter rinse.  Removing lice, eggs, and egg cases by using a comb or tweezers.  Washing and bagging your clothing and bedding.  Pregnant women should not use medicated shampoo or cream without first talking to their health care provider. Follow these instructions at home: Using medicated rinse Apply medicated rinse as told by your health care provider. Follow the label instructions carefully. General instructions for applying rinses may include these steps: 1. Put on an old shirt or underwear, or use an old towel in case of staining from the rinse. 2. Wash and towel-dry your head or pubic area before applying the rinse if directed to do so. 3. When your hair is dry, apply the rinse. Leave the rinse in your hair for the amount of time specified in the instructions. 4. Rinse the area with water. 5. Comb your wet hair with a fine-tooth comb. Comb it close to the skin and down to the ends, removing any lice, eggs, or egg cases. A lice comb may be included with the medicated rinse. 6. Do not wash the infested hair for 2 days while the medicine kills the lice. 7. After the treatment, repeat combing out your hair and removing lice, eggs, or egg cases from the hair every 2-3 days. Do this for about 2-3 weeks. After treatment, the  remaining lice should be moving more slowly. 8. Repeat the treatment if necessary in 7-10 days.  General instructions  Remove any remaining lice, eggs, or egg cases using a fine-tooth comb.  Use hot water to wash all towels, hats, scarves, jackets, bedding, and clothing that you have recently used.  Put any non-washable items that may have been exposed into plastic bags. Keep the bags closed for 2 weeks.  Soak all combs and brushes in hot water for 10 minutes.  Vacuum furniture to remove any loose hair.  There is no need to use chemicals, which can be poisonous (toxic). Lice survive for only 1-2 days away from human skin. Eggs may survive for only 1 week.  For pubic lice, tell any sexual partners to seek treatment.  For head lice, ask your health care provider if other family members or close contacts should be examined or treated as well.  Keep all follow-up visits as told by your health care provider. This is important. Contact a health care provider if:  You develop sores that look infected.  Your rash or sores do not go away in 1 week.  The lice or eggs return or do not go away in spite of treatment. Summary  Lice are tiny parasitic insects that live on the human body. A parasite is an insect that lives off another animal and cannot survive without it.  Lice can spread from one person to another through close contact with an infested person or by sharing personal items, such as combs, brushes, or hats.  Lice can be treated with a medicated rinse. Follow your health care provider's instructions, or instructions on the label, if you are being treated with this medicine.  Ask your health care provider if your family members or close contacts should be treated for lice. This information is not intended to replace advice given to you by your health care provider. Make sure you discuss any questions you have with your health care provider. Document Released: 02/23/2005 Document Revised: 03/04/2016 Document Reviewed: 03/04/2016 Elsevier Interactive Patient Education  2017 [employer]  

## 2016-12-17 NOTE — Progress Notes (Signed)
66 y.o. Married Caucasian female [phone] here with complaint of vaginal symptoms of itching in vulva area and on perineal area. Has noted little red pin top size redness in vulva area.  Has tried OTC steroid with slight relief only. Slight increase vaginal discharge.  Describes discharge as clear..Onset of symptoms 5 days ago. Denies new personal products or vaginal dryness. No change in products, but has been out in [place] and yard, ?? Exposure to irritate. 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 Urinary symptoms none . Contraception is Menopausal.  Review of Systems  Constitutional: Negative.   Gastrointestinal: Negative.   Genitourinary: Negative for dysuria, frequency and urgency.  Psychiatric/Behavioral: Negative.     O:Healthy female WDWN Affect: normal, orientation x 3  Exam: Skin: warm and dry no lesions Scalp: No lice or nits noted Abdomen: Soft, non tender Inguinal Lymph nodes: no enlargement or tenderness Pelvic exam: External genital: normal female, with petecchaie like areas on mons and vulva. Small nits noted hair in pubic area, no moving nymphs noted, slide taken BUS: negative Vagina: scant  discharge noted.  Affirm taken Cervix: normal, non tender, no CMT Uterus: normal, non tender Adnexa:normal, non tender, no masses or fullness noted   Wet Prep results: nit noted under micoscope   A:Normal pelvic exam Pubic lice ? Etiology of exposure   P:Discussed findings of Pubic lice and etiology. Patient is aware due to working school and not sure if she work. Given information for OTC shampoo and instructions, to wash all areas of contact, bed linen and clothing. Check with spouse to see if any concerns. Discussed nits do not indicate active louse, since none seen. But need to treat, due to presence. Questions addressed. Discussed Aveeno or sitz bath for comfort. After second treatment.    Lab: Affirm  Rv prn

## 2016-12-18 LAB — VAGINITIS/VAGINOSIS, DNA PROBE
Candida Species: NEGATIVE
Gardnerella vaginalis: NEGATIVE
TRICHOMONAS VAG: NEGATIVE

## 2017-10-14 ENCOUNTER — Other Ambulatory Visit (HOSPITAL_COMMUNITY): Payer: Self-pay | Admitting: Internal Medicine

## 2017-10-14 DIAGNOSIS — Z1231 Encounter for screening mammogram for malignant neoplasm of breast: Secondary | ICD-10-CM

## 2017-11-29 ENCOUNTER — Ambulatory Visit (HOSPITAL_COMMUNITY): Payer: BC Managed Care – PPO

## 2017-12-09 ENCOUNTER — Encounter: Payer: Self-pay | Admitting: Certified Nurse Midwife

## 2017-12-09 ENCOUNTER — Other Ambulatory Visit (HOSPITAL_COMMUNITY)
Admission: RE | Admit: 2017-12-09 | Discharge: 2017-12-09 | Disposition: A | Discharge status: Home / Self Care | Payer: BC Managed Care – PPO | Source: Ambulatory Visit | Attending: Obstetrics & Gynecology | Admitting: Obstetrics & Gynecology

## 2017-12-09 ENCOUNTER — Other Ambulatory Visit: Payer: Self-pay

## 2017-12-09 ENCOUNTER — Ambulatory Visit: Payer: BC Managed Care – PPO | Admitting: Certified Nurse Midwife

## 2017-12-09 VITALS — BP 100/62 | HR 64 | Resp 16 | Ht 61.25 in | Wt 105.0 lb

## 2017-12-09 DIAGNOSIS — Z8742 Personal history of other diseases of the female genital tract: Secondary | ICD-10-CM

## 2017-12-09 DIAGNOSIS — Z01419 Encounter for gynecological examination (general) (routine) without abnormal findings: Secondary | ICD-10-CM | POA: Diagnosis not present

## 2017-12-09 DIAGNOSIS — Z124 Encounter for screening for malignant neoplasm of cervix: Secondary | ICD-10-CM | POA: Insufficient documentation

## 2017-12-09 DIAGNOSIS — N952 Postmenopausal atrophic vaginitis: Secondary | ICD-10-CM
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
## 2017-12-09 NOTE — Patient Instructions (Signed)

## 2017-12-13 ENCOUNTER — Ambulatory Visit (HOSPITAL_COMMUNITY)
Admission: RE | Admit: 2017-12-13 | Discharge: 2017-12-13 | Disposition: A | Discharge status: Home / Self Care | Payer: BC Managed Care – PPO | Source: Ambulatory Visit | Attending: Internal Medicine | Admitting: Internal Medicine

## 2017-12-13 DIAGNOSIS — Z1231 Encounter for screening mammogram for malignant neoplasm of breast: Secondary | ICD-10-CM | POA: Diagnosis not present

## 2017-12-13 LAB — CYTOLOGY - PAP: HPV (WINDOPATH): NOT DETECTED

## 2018-04-27 ENCOUNTER — Other Ambulatory Visit (HOSPITAL_COMMUNITY): Payer: Self-pay | Admitting: Internal Medicine

## 2018-04-27 DIAGNOSIS — N200 Calculus of kidney: Secondary | ICD-10-CM

## 2018-04-28 ENCOUNTER — Ambulatory Visit (HOSPITAL_COMMUNITY)
Admission: RE | Admit: 2018-04-28 | Discharge: 2018-04-28 | Disposition: A | Discharge status: Home / Self Care | Payer: Medicare Other | Source: Ambulatory Visit | Attending: Internal Medicine | Admitting: Internal Medicine

## 2018-04-28 DIAGNOSIS — N200 Calculus of kidney: Secondary | ICD-10-CM | POA: Insufficient documentation

## 2018-11-01 IMAGING — MG DIGITAL SCREENING BILATERAL MAMMOGRAM WITH TOMO AND CAD
8 series · 9 of 24 positions shown · non-contrast
Comparison: Previous exam(s).

CLINICAL DATA: Screening.

EXAM:
DIGITAL SCREENING BILATERAL MAMMOGRAM WITH TOMO AND CAD

[L MLO synth-2D]
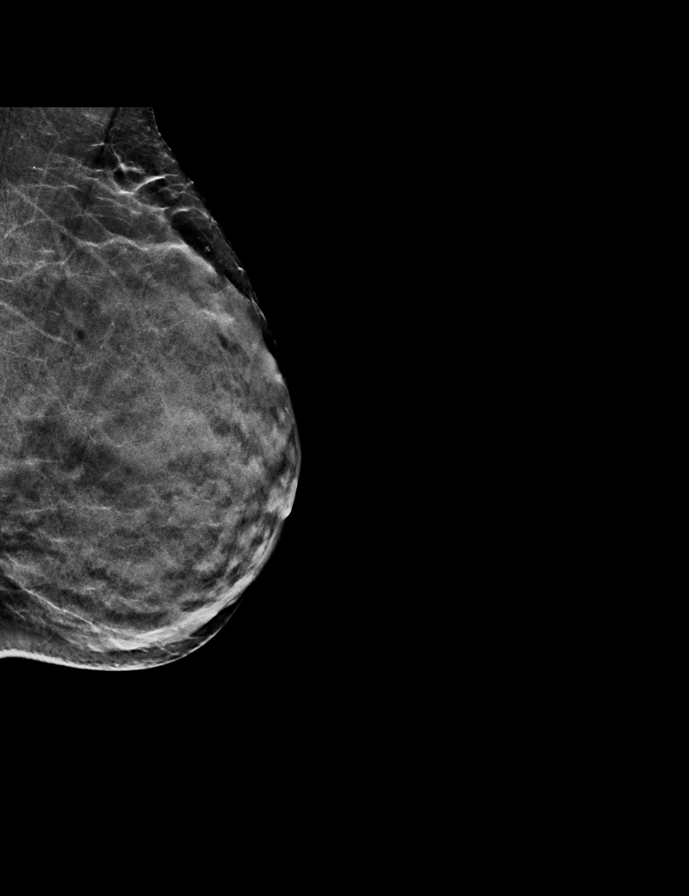

[L CC synth-2D]
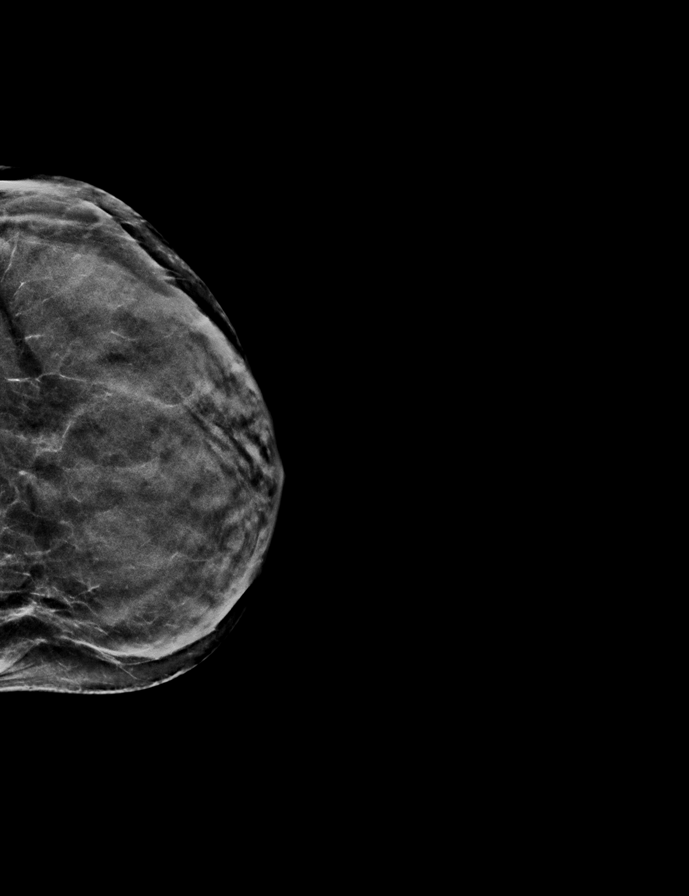

[R MLO synth-2D]
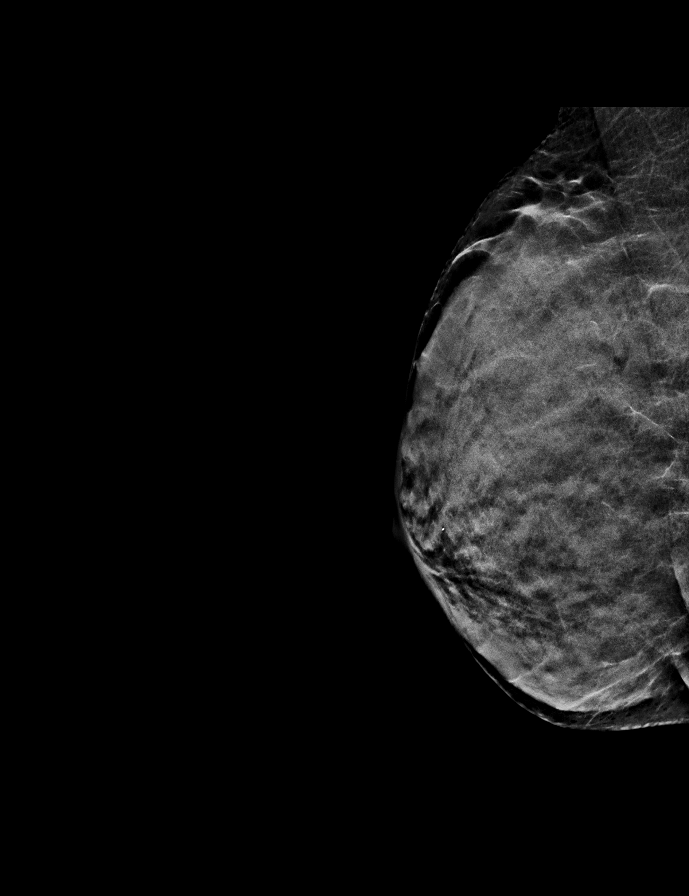

[R CC synth-2D]
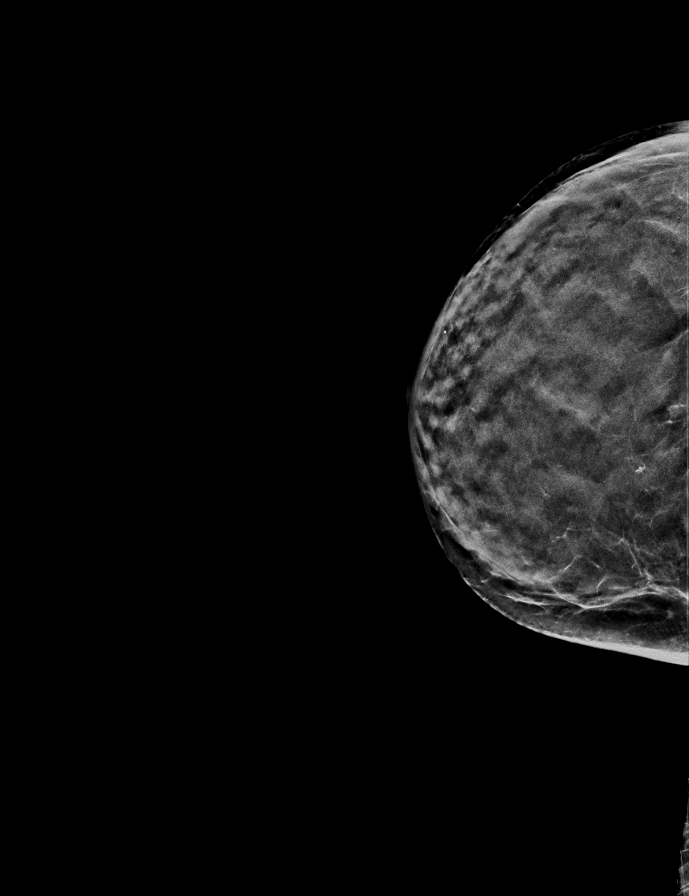

[L MLO tomo · 2 of 45 frames shown]
[frame 15/45]
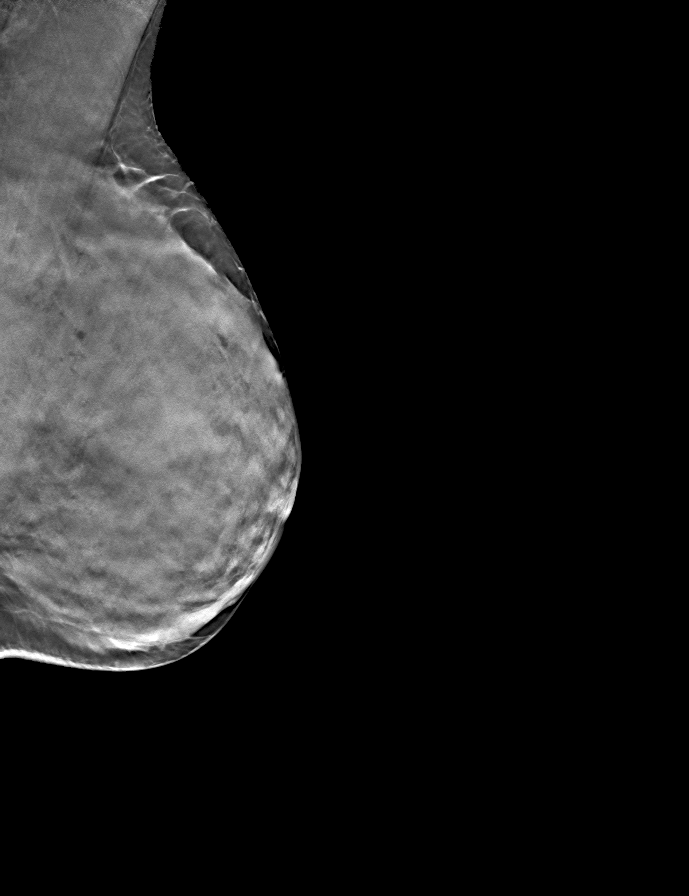
[frame 23/45]
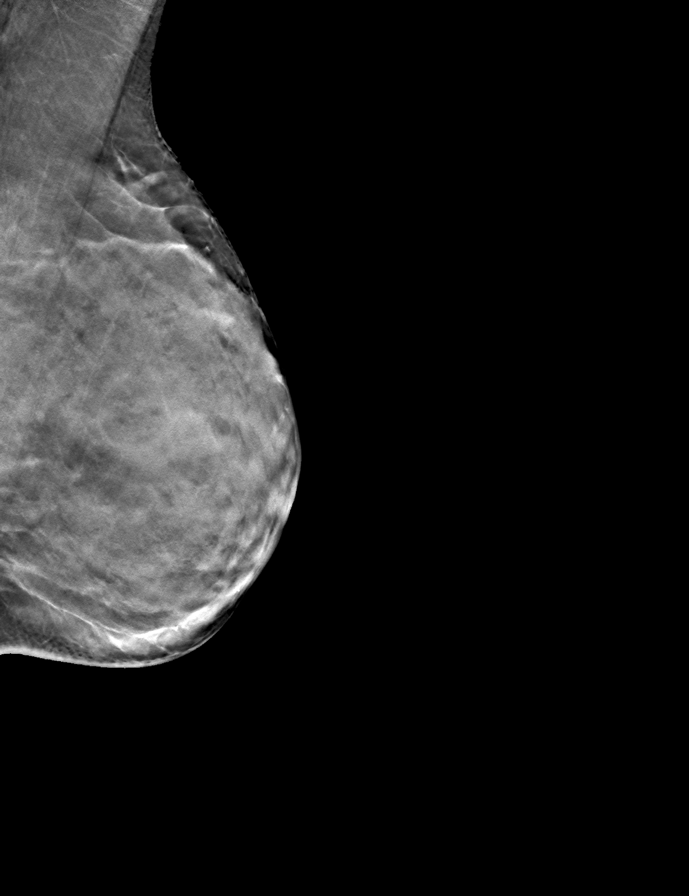

[R MLO tomo · tomo slice 21/42.0]
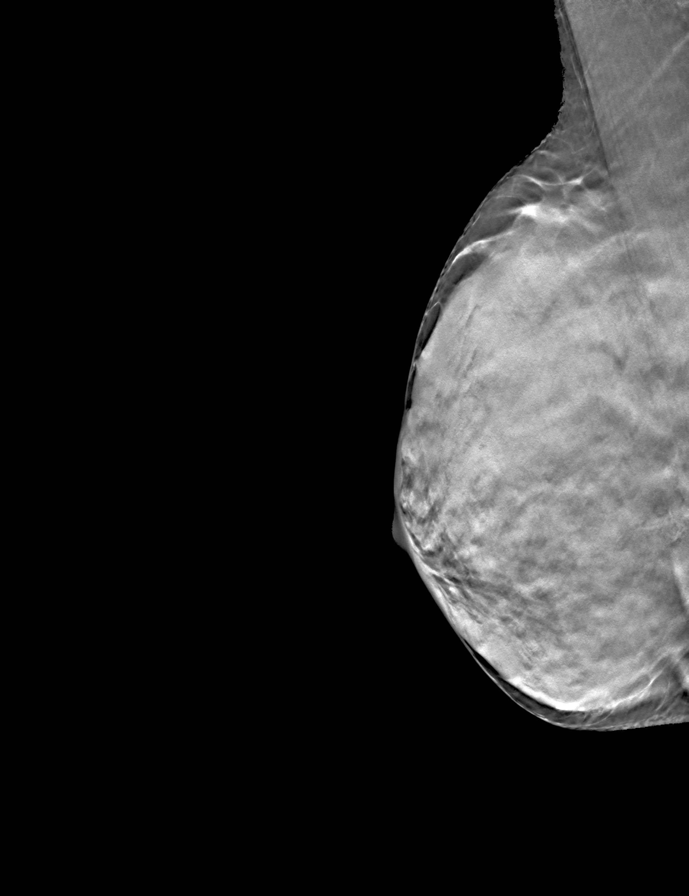

[L CC tomo · tomo slice 25/49.0]
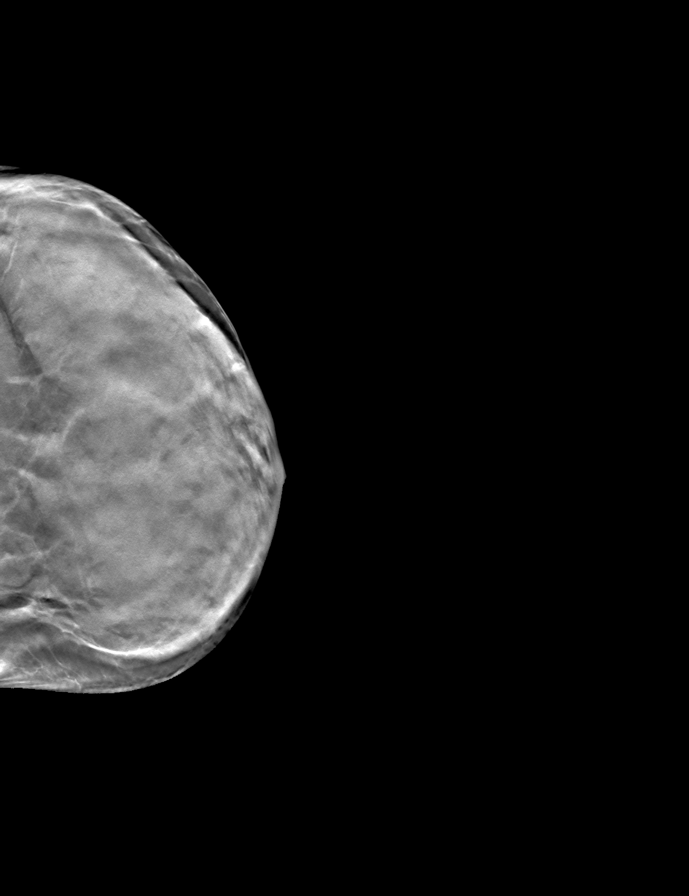

[R CC tomo · tomo slice 25/48.0]
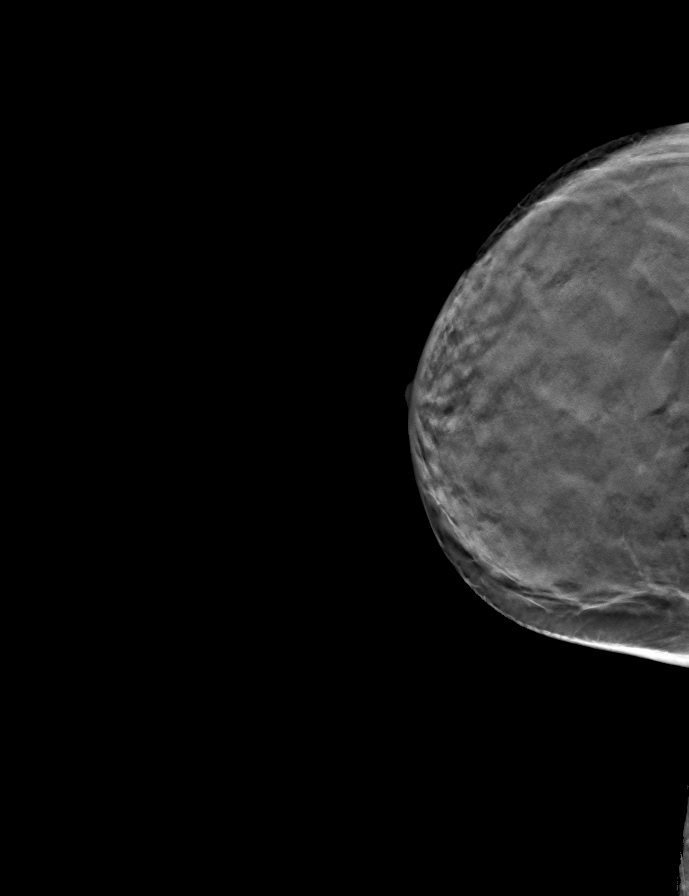

[9 of 24 positions shown; findings below may reference images not displayed]

ACR Breast Density Category d: The breast tissue is extremely dense,
which lowers the sensitivity of mammography.
FINDINGS: There are no findings suspicious for malignancy. Images were
processed with CAD.
IMPRESSION: No mammographic evidence of malignancy. A result letter of this
screening mammogram will be mailed directly to the patient.

RECOMMENDATION:
Screening mammogram in one year. (Code:RA-I-AVB)

BI-RADS CATEGORY  1: Negative.

## 2018-11-11 ENCOUNTER — Other Ambulatory Visit (HOSPITAL_COMMUNITY): Payer: Self-pay | Admitting: Internal Medicine

## 2018-11-11 DIAGNOSIS — Z1231 Encounter for screening mammogram for malignant neoplasm of breast: Secondary | ICD-10-CM

## 2018-12-08 ENCOUNTER — Other Ambulatory Visit: Payer: Self-pay

## 2018-12-12 ENCOUNTER — Encounter: Payer: Self-pay | Admitting: Certified Nurse Midwife

## 2018-12-12 ENCOUNTER — Other Ambulatory Visit: Payer: Self-pay

## 2018-12-12 ENCOUNTER — Ambulatory Visit (INDEPENDENT_AMBULATORY_CARE_PROVIDER_SITE_OTHER): Payer: Medicare Other | Admitting: Certified Nurse Midwife

## 2018-12-12 VITALS — BP 110/70 | HR 64 | Temp 97.2°F | Resp 16 | Ht 61.75 in | Wt 108.0 lb

## 2018-12-12 DIAGNOSIS — Z01419 Encounter for gynecological examination (general) (routine) without abnormal findings: Secondary | ICD-10-CM | POA: Diagnosis not present

## 2018-12-12 NOTE — Patient Instructions (Signed)

## 2018-12-12 NOTE — Progress Notes (Addendum)
68 y.o. [id number] Married  Caucasian Fe here for annual exam. Post menopausal no vaginal bleeding.  Vaginal dryness seems better with Coconut use nightly. She and spouse have retired and enjoying the time. Saw Dr. Willey Blade in 10/2018 all normal and labs.  Patient has not had BMD and has colonoscopy due this year. No other health concerns today.  Patient's last menstrual period was 01/07/1998.          Sexually active: Yes.    The current method of family planning is vasectomy.    Exercising: Yes.    walk, home exercise Smoker:  no  Review of Systems  Constitutional: Negative.   HENT: Negative.   Eyes: Negative.   Respiratory: Negative.   Cardiovascular: Negative.   Gastrointestinal: Negative.   Genitourinary:       Lower pelvic pains occ  Musculoskeletal: Negative.   Skin: Negative.   Neurological: Negative.   Endo/Heme/Allergies: Negative.   Psychiatric/Behavioral: Negative.     Health Maintenance: Pap:  12-03-15 neg HPV HR+ 16 18/45 neg, 12-03-16 neg HPV HR neg, 12-09-17 neg HPV HR neg History of Abnormal Pap: yes MMG:  12-13-17 category d density birads 1:neg Lifetime risk is 4.5% with TC risk tool Self Breast exams: occ Colonoscopy:  2010 f/u 10 yrs has discussed with GI and will schedule soon BMD:  2015 osteopenia, pcp is not managing now  TDaP:  2015 Shingles: had done Pneumonia: had done Hep C and HIV: not done Labs: with PCP   reports that she has never smoked. She has never used smokeless tobacco. She reports current alcohol use of about 1.0 standard drinks of alcohol per week. She reports that she does not use drugs.  Past Medical History:  Diagnosis Date  . Abnormal Pap smear of cervix    12-03-15 neg HPV HR+, 16/18 neg  . IBS (irritable bowel syndrome)   . Osteopenia     Past Surgical History:  Procedure Laterality Date  . BUNIONECTOMY     left & right  . CATARACT EXTRACTION, BILATERAL    . THYROIDECTOMY, PARTIAL  2001    Current Outpatient Medications   Medication Sig Dispense Refill  . Methylcellulose, Laxative, (CITRUCEL PO) Take by mouth daily.    . Multiple Vitamins-Minerals (MULTIVITAMIN PO) Take by mouth daily.     No current facility-administered medications for this visit.     Family History  Problem Relation Age of Onset  . Cancer Father        prostate  . Parkinson's disease Father   . Other Brother        blood clot    ROS:  Pertinent items are noted in HPI.  Otherwise, a comprehensive ROS was negative.  Exam:   BP 110/70   Pulse 64   Temp (!) 97.2 F (36.2 C) (Skin)   Resp 16   Ht 5' 1.75" (1.568 m)   Wt 108 lb (49 kg)   LMP 01/07/1998   BMI 19.91 kg/m  Height: 5' 1.75" (156.8 cm) Ht Readings from Last 3 Encounters:  12/12/18 5' 1.75" (1.568 m)  12/09/17 5' 1.25" (1.556 m)  12/17/16 5' 1.5" (1.562 m)    General appearance: alert, cooperative and appears stated age Head: Normocephalic, without obvious abnormality, atraumatic Neck: no adenopathy, supple, symmetrical, trachea midline and thyroid normal to inspection and palpation Lungs: clear to auscultation bilaterally Breasts: normal appearance, no masses or tenderness, No nipple retraction or dimpling, No nipple discharge or bleeding, No axillary or supraclavicular adenopathy Heart: regular  rate and rhythm Abdomen: soft, non-tender; no masses,  no organomegaly Extremities: extremities normal, atraumatic, no cyanosis or edema Skin: Skin color, texture, turgor normal. No rashes or lesions Lymph nodes: Cervical, supraclavicular, and axillary nodes normal. No abnormal inguinal nodes palpated Neurologic: Grossly normal   Pelvic: External genitalia:  no lesions              Urethra:  normal appearing urethra with no masses, tenderness or lesions              Bartholin's and Skene's: normal                 Vagina: normal appearing vagina with normal color and discharge, no lesions, slight atrophy, but improved with moisture present              Cervix: no  cervical motion tenderness and no lesions              Pap taken: No. Bimanual Exam:  Uterus:  normal size, contour, position, consistency, mobility, non-tender and anteverted              Adnexa: normal adnexa and no mass, fullness, tenderness               Rectovaginal: Confirms               Anus:  normal sphincter tone, no lesions  Chaperone present: yes  A:  Well Woman with normal exam  Post menopausal with atrophic vaginitis, coconut oil working well   BMD and mammogram due  Colonoscopy due    P:   Reviewed health and wellness pertinent to exam  Discussed finding and encouraged to continue using coconut oil and advise if issues.  Discussed importance of mammogram patient has scheduled.  She will check with [facility] and call with specifics for order for BMD.  Has discussed with GI and plans to do this year.  Pap smear: no   counseled on breast self exam, mammography screening, feminine hygiene, adequate intake of calcium and vitamin D, diet and exercise, Kegel's exercises  return annually or prn  An After Visit Summary was printed and given to the patient.

## 2018-12-14 ENCOUNTER — Other Ambulatory Visit (HOSPITAL_COMMUNITY): Payer: Self-pay | Admitting: Internal Medicine

## 2018-12-14 DIAGNOSIS — Z780 Asymptomatic menopausal state: Secondary | ICD-10-CM

## 2018-12-15 ENCOUNTER — Ambulatory Visit: Payer: BC Managed Care – PPO | Admitting: Certified Nurse Midwife

## 2018-12-15 ENCOUNTER — Encounter (INDEPENDENT_AMBULATORY_CARE_PROVIDER_SITE_OTHER): Payer: Self-pay | Admitting: *Deleted

## 2018-12-16 ENCOUNTER — Ambulatory Visit (HOSPITAL_COMMUNITY)
Admission: RE | Admit: 2018-12-16 | Discharge: 2018-12-16 | Disposition: A | Discharge status: Home / Self Care | Payer: Medicare Other | Source: Ambulatory Visit | Attending: Internal Medicine | Admitting: Internal Medicine

## 2018-12-16 ENCOUNTER — Other Ambulatory Visit: Payer: Self-pay

## 2018-12-16 DIAGNOSIS — Z1231 Encounter for screening mammogram for malignant neoplasm of breast: Secondary | ICD-10-CM | POA: Diagnosis present

## 2019-01-09 ENCOUNTER — Encounter (INDEPENDENT_AMBULATORY_CARE_PROVIDER_SITE_OTHER): Payer: Self-pay | Admitting: *Deleted

## 2019-01-10 ENCOUNTER — Other Ambulatory Visit (INDEPENDENT_AMBULATORY_CARE_PROVIDER_SITE_OTHER): Payer: Self-pay | Admitting: *Deleted

## 2019-01-11 ENCOUNTER — Other Ambulatory Visit (INDEPENDENT_AMBULATORY_CARE_PROVIDER_SITE_OTHER): Payer: Self-pay | Admitting: *Deleted

## 2019-01-11 DIAGNOSIS — Z800 Family history of malignant neoplasm of digestive organs: Secondary | ICD-10-CM

## 2019-01-11 DIAGNOSIS — Z1211 Encounter for screening for malignant neoplasm of colon: Secondary | ICD-10-CM

## 2019-02-09 ENCOUNTER — Encounter (INDEPENDENT_AMBULATORY_CARE_PROVIDER_SITE_OTHER): Payer: Self-pay | Admitting: *Deleted

## 2019-02-09 ENCOUNTER — Other Ambulatory Visit (INDEPENDENT_AMBULATORY_CARE_PROVIDER_SITE_OTHER): Payer: Self-pay | Admitting: *Deleted

## 2019-02-09 ENCOUNTER — Telehealth (INDEPENDENT_AMBULATORY_CARE_PROVIDER_SITE_OTHER): Payer: Self-pay | Admitting: *Deleted

## 2019-02-09 DIAGNOSIS — Z1211 Encounter for screening for malignant neoplasm of colon: Secondary | ICD-10-CM

## 2019-02-09 MED ORDER — SUPREP BOWEL PREP KIT 17.5-3.13-1.6 GM/177ML PO SOLN: 1.0000 | Freq: Once | ORAL | 0 refills | Status: AC

## 2019-02-09 NOTE — Telephone Encounter (Signed)
Patient needs suprep TCS sch'd 1/28 

## 2019-02-17 ENCOUNTER — Encounter (INDEPENDENT_AMBULATORY_CARE_PROVIDER_SITE_OTHER): Payer: Self-pay | Admitting: *Deleted

## 2019-03-07 ENCOUNTER — Ambulatory Visit (INDEPENDENT_AMBULATORY_CARE_PROVIDER_SITE_OTHER): Payer: Self-pay

## 2019-03-07 ENCOUNTER — Other Ambulatory Visit: Payer: Self-pay

## 2019-03-07 ENCOUNTER — Telehealth (INDEPENDENT_AMBULATORY_CARE_PROVIDER_SITE_OTHER): Payer: Self-pay | Admitting: *Deleted

## 2019-03-07 NOTE — Telephone Encounter (Signed)
Referring MD/PCP: fagan   Procedure: tcs  Reason/Indication:  Screening, fam hx colon ca   Has patient had this procedure before?  Yes, 2010  If so, when, by whom and where?    Is there a family history of colon cancer?  Yes, paternal grandmother, uncles  Who?  What age when diagnosed?    Is patient diabetic?   no      Does patient have prosthetic heart valve or mechanical valve?  no  Do you have a pacemaker/defibrillator?  no  Has patient ever had endocarditis/atrial fibrillation? no  Does patient use oxygen? no  Has patient had joint replacement within last 12 months?  no  Is patient constipated or do they take laxatives? no  Does patient have a history of alcohol/drug use?  no  Is patient on blood thinner such as Coumadin, Plavix and/or Aspirin? no  Medications: MVI daily, citrucel daily  Allergies: nkda  Medication Adjustment per Dr Laural Golden:   Procedure date & time: 04/06/19 at 730

## 2019-03-14 NOTE — Telephone Encounter (Signed)
Colonoscopy with conscious sedation 

## 2019-04-04 ENCOUNTER — Other Ambulatory Visit (HOSPITAL_COMMUNITY): Payer: Medicare Other

## 2019-04-05 ENCOUNTER — Encounter (INDEPENDENT_AMBULATORY_CARE_PROVIDER_SITE_OTHER): Payer: Self-pay | Admitting: *Deleted

## 2019-04-05 ENCOUNTER — Other Ambulatory Visit (INDEPENDENT_AMBULATORY_CARE_PROVIDER_SITE_OTHER): Payer: Self-pay | Admitting: *Deleted

## 2019-04-05 DIAGNOSIS — Z1211 Encounter for screening for malignant neoplasm of colon: Secondary | ICD-10-CM

## 2019-04-05 DIAGNOSIS — Z800 Family history of malignant neoplasm of digestive organs: Secondary | ICD-10-CM

## 2019-04-06 ENCOUNTER — Encounter (HOSPITAL_COMMUNITY): Payer: Self-pay

## 2019-04-06 ENCOUNTER — Ambulatory Visit (HOSPITAL_COMMUNITY): Admit: 2019-04-06 | Payer: Medicare Other | Admitting: Internal Medicine

## 2019-04-06 DIAGNOSIS — Z1211 Encounter for screening for malignant neoplasm of colon: Secondary | ICD-10-CM | POA: Insufficient documentation

## 2019-04-06 DIAGNOSIS — Z800 Family history of malignant neoplasm of digestive organs: Secondary | ICD-10-CM | POA: Insufficient documentation

## 2019-04-06 SURGERY — COLONOSCOPY
Anesthesia: Moderate Sedation

## 2019-05-09 ENCOUNTER — Other Ambulatory Visit: Payer: Self-pay

## 2019-05-09 ENCOUNTER — Other Ambulatory Visit (HOSPITAL_COMMUNITY)
Admission: RE | Admit: 2019-05-09 | Discharge: 2019-05-09 | Disposition: A | Discharge status: Home / Self Care | Payer: Medicare PPO | Source: Ambulatory Visit | Attending: Internal Medicine | Admitting: Internal Medicine

## 2019-05-09 DIAGNOSIS — Z01812 Encounter for preprocedural laboratory examination: Secondary | ICD-10-CM | POA: Diagnosis not present

## 2019-05-09 DIAGNOSIS — Z20822 Contact with and (suspected) exposure to covid-19: Secondary | ICD-10-CM | POA: Diagnosis not present

## 2019-05-09 LAB — SARS CORONAVIRUS 2 (TAT 6-24 HRS): SARS Coronavirus 2: NEGATIVE

## 2019-05-11 ENCOUNTER — Encounter (HOSPITAL_COMMUNITY)
Admission: RE | Disposition: A | Discharge status: Home / Self Care | Payer: Self-pay | Source: Home / Self Care | Attending: Internal Medicine

## 2019-05-11 ENCOUNTER — Ambulatory Visit (HOSPITAL_COMMUNITY)
Admission: RE | Admit: 2019-05-11 | Discharge: 2019-05-11 | Disposition: A | Discharge status: Home / Self Care | Payer: Medicare PPO | Attending: Internal Medicine | Admitting: Internal Medicine

## 2019-05-11 ENCOUNTER — Other Ambulatory Visit: Payer: Self-pay

## 2019-05-11 ENCOUNTER — Encounter (HOSPITAL_COMMUNITY): Payer: Self-pay | Admitting: Internal Medicine

## 2019-05-11 DIAGNOSIS — M8580 Other specified disorders of bone density and structure, unspecified site: Secondary | ICD-10-CM | POA: Insufficient documentation

## 2019-05-11 DIAGNOSIS — K589 Irritable bowel syndrome without diarrhea: Secondary | ICD-10-CM | POA: Diagnosis not present

## 2019-05-11 DIAGNOSIS — Z800 Family history of malignant neoplasm of digestive organs: Secondary | ICD-10-CM | POA: Insufficient documentation

## 2019-05-11 DIAGNOSIS — Z1211 Encounter for screening for malignant neoplasm of colon: Secondary | ICD-10-CM | POA: Insufficient documentation

## 2019-05-11 HISTORY — PX: COLONOSCOPY: SHX5424

## 2019-05-11 SURGERY — COLONOSCOPY
Anesthesia: Moderate Sedation

## 2019-05-11 MED ORDER — MIDAZOLAM HCL 5 MG/5ML IJ SOLN
INTRAMUSCULAR | Status: AC
  Filled 2019-05-11: qty 10

## 2019-05-11 MED ORDER — SODIUM CHLORIDE 0.9 % IV SOLN: INTRAVENOUS | Status: DC

## 2019-05-11 MED ORDER — MEPERIDINE HCL 50 MG/ML IJ SOLN
INTRAMUSCULAR | Status: AC
  Filled 2019-05-11: qty 1

## 2019-05-11 MED ORDER — MEPERIDINE HCL 50 MG/ML IJ SOLN
INTRAMUSCULAR | Status: DC | PRN
  Administered 2019-05-11 (×2): 25 mg via INTRAVENOUS

## 2019-05-11 MED ORDER — STERILE WATER FOR IRRIGATION IR SOLN
Status: DC | PRN
  Administered 2019-05-11: 1.5 mL

## 2019-05-11 MED ORDER — MIDAZOLAM HCL 5 MG/5ML IJ SOLN
INTRAMUSCULAR | Status: DC | PRN
  Administered 2019-05-11: 1 mg via INTRAVENOUS
  Administered 2019-05-11: 2 mg via INTRAVENOUS
  Administered 2019-05-11: 1 mg via INTRAVENOUS
  Administered 2019-05-11: 2 mg via INTRAVENOUS

## 2019-05-11 NOTE — Discharge Instructions (Signed)
Resume usual medications and diet as before. No driving for 24 hours. Next screening exam in 10 years.   Colonoscopy, Adult, Care After This sheet gives you information about how to care for yourself after your procedure. Your health care provider may also give you more specific instructions. If you have problems or questions, contact your health care provider.  Dr. Karilyn Cota:  [phone] What can I expect after the procedure? After the procedure, it is common to have:  A small amount of blood in your stool for 24 hours after the procedure.  Some gas.  Mild cramping or bloating of your abdomen. Follow these instructions at home: Eating and drinking   Drink enough fluid to keep your urine pale yellow.  Resume your normal diet as instructed by your health care provider.  Activity  Rest as told by your health care provider.  Return to your normal activities as told by your health care provider.  Managing cramping and bloating   Try walking around when you have cramps or feel bloated.  General instructions  For the first 24 hours after the procedure: ? Do not drive or use machinery. ? Do not sign important documents. ? Do not drink alcohol. ? Do your regular activities at a slower pace than normal.  Keep all follow-up visits as told by your health care provider. This is important. Contact a health care provider if:  You have blood in your stool 2-3 days after the procedure. Get help right away if you have:  More than a small spotting of blood in your stool.  Large blood clots in your stool.  Swelling of your abdomen.  Nausea or vomiting.  A fever.  Increasing pain in your abdomen that is not relieved with medicine. Summary  After the procedure, it is common to have a small amount of blood in your stool. You may also have mild cramping and bloating of your abdomen.  For the first 24 hours after the procedure, do not drive or use machinery, sign important  documents, or drink alcohol.  Get help right away if you have a lot of blood in your stool, nausea or vomiting, a fever, or increased pain in your abdomen. This information is not intended to replace advice given to you by your health care provider. Make sure you discuss any questions you have with your health care provider. Document Revised: 09/19/2018 Document Reviewed: 09/19/2018 Elsevier Patient Education  2020 [employer].

## 2019-05-11 NOTE — H&P (Addendum)
Angelica Hicks is an 69 y.o. female.   Chief Complaint: Patient is here for colonoscopy. HPI: Patient is 69 year old Caucasian female who is here for screening colonoscopy.  Last exam was normal in 2010.  She denies abdominal pain change in bowel habits or rectal bleeding. Family history is positive for CRC in paternal aunt.  Past Medical History:  Diagnosis Date  . Abnormal Pap smear of cervix    12-03-15 neg HPV HR+, 16/18 neg  . IBS (irritable bowel syndrome)   . Osteopenia     Past Surgical History:  Procedure Laterality Date  . BUNIONECTOMY     left & right  . CATARACT EXTRACTION, BILATERAL    . THYROIDECTOMY, PARTIAL  2001    Family History  Problem Relation Age of Onset  . Cancer Father        prostate  . Parkinson's disease Father   . Other Brother        blood clot  .     Marland Kitchen Colon cancer Paternal Aunt    Social History:  reports that she has never smoked. She has never used smokeless tobacco. She reports current alcohol use of about 1.0 standard drinks of alcohol per week. She reports that she does not use drugs.  Allergies: No Known Allergies  Medications Prior to Admission  Medication Sig Dispense Refill  . Glycerin-Hypromellose-PEG 400 (DRY EYE RELIEF DROPS) 0.2-0.2-1 % SOLN Place 1 drop into both eyes daily as needed (Dry eyes).    . Methylcellulose, Laxative, (CITRUCEL PO) Take 2 tablets by mouth daily.     . Multiple Vitamins-Minerals (MULTIVITAMIN PO) Take 1 tablet by mouth daily. Centrum Silver      No results found for this or any previous visit (from the past 48 hour(s)). No results found.  Review of Systems  Blood pressure 129/76, pulse (!) 107, temperature 97.9 F (36.6 C), temperature source Oral, resp. rate 18, height 5' 1.5" (1.562 m), weight 48.1 kg, last menstrual period 01/07/1998, SpO2 100 %. Physical Exam  Constitutional:  Well-developed thin Caucasian female in NAD.  HENT:  Mouth/Throat: Oropharynx is clear and moist.  Eyes:  Conjunctivae are normal. No scleral icterus.  Neck: No thyromegaly present.  Thyroid collar scar  Cardiovascular: Normal rate, regular rhythm and normal heart sounds.  No murmur heard. Respiratory: Effort normal and breath sounds normal.  GI:  Abdomen is flat soft and nontender with organomegaly or masses.  Musculoskeletal:        General: No edema.  Lymphadenopathy:    She has no cervical adenopathy.  Neurological: She is alert.  Skin: Skin is warm and dry.     Assessment/Plan Average risk screening colonoscopy. Patient has only one second-degree relative with history of colon carcinoma.  Lionel December, MD 05/11/2019, 9:20 AM

## 2019-05-11 NOTE — Op Note (Signed)
[facility] Patient Name: Angelica Hicks Procedure Date: 05/11/2019 9:01 AM MRN: [id number] Date of Birth: Mar 31, 1950 Attending MD: Lionel December , MD CSN: [id number] Age: 69 Admit Type: Outpatient Procedure:                Colonoscopy Indications:              Screening for colorectal malignant neoplasm Providers:                Lionel December, MD, Buel Ream. Thomasena Edis RN, RN, Dyann Ruddle Referring MD:             Carylon Perches, MD Medicines:                Meperidine 50 mg IV, Midazolam 6 mg IV Complications:            No immediate complications. Estimated Blood Loss:     Estimated blood loss: none. Procedure:                Pre-Anesthesia Assessment:                           - Prior to the procedure, a History and Physical                            was performed, and patient medications and                            allergies were reviewed. The patient's tolerance of                            previous anesthesia was also reviewed. The risks                            and benefits of the procedure and the sedation                            options and risks were discussed with the patient.                            All questions were answered, and informed consent                            was obtained. Prior Anticoagulants: The patient has                            taken no previous anticoagulant or antiplatelet                            agents. ASA Grade Assessment: I - A normal, healthy                            patient. After reviewing the risks and benefits,  the patient was deemed in satisfactory condition to                            undergo the procedure.                           After obtaining informed consent, the colonoscope                            was passed under direct vision. Throughout the                            procedure, the patient's blood pressure, pulse, and   oxygen saturations were monitored continuously. The                            PCF-[id number] ([id number]) was introduced through the                            anus and advanced to the the cecum, identified by                            appendiceal orifice and ileocecal valve. The                            colonoscopy was technically difficult and complex                            due to a redundant colon. Successful completion of                            the procedure was aided by changing the patient to                            a supine position and withdrawing the scope and                            replacing with the 'babyscope'. The patient                            tolerated the procedure well. The quality of the                            bowel preparation was good. The ileocecal valve,                            appendiceal orifice, and rectum were photographed. Scope In: 9:30:52 AM Scope Out: 10:03:08 AM Scope Withdrawal Time: 0 hours 6 minutes 38 seconds  Total Procedure Duration: 0 hours 32 minutes 16 seconds  Findings:      The perianal and digital rectal examinations were normal.      The colon (entire examined portion) appeared normal.      The retroflexed view of the distal rectum and anal verge was normal and  showed no anal or rectal abnormalities. Impression:               - The entire examined colon is normal.                           - No specimens collected. Moderate Sedation:      Moderate (conscious) sedation was administered by the endoscopy nurse       and supervised by the endoscopist. The following parameters were       monitored: oxygen saturation, heart rate, blood pressure, CO2       capnography and response to care. Total physician intraservice time was       38 minutes. Recommendation:           - Patient has a contact number available for                            emergencies. The signs and symptoms of potential                             delayed complications were discussed with the                            patient. Return to normal activities tomorrow.                            Written discharge instructions were provided to the                            patient.                           - Resume previous diet today.                           - Continue present medications.                           - Repeat colonoscopy in 10 years for screening                            purposes. Procedure Code(s):        --- Professional ---                           [phone], Colonoscopy, flexible; diagnostic, including                            collection of specimen(s) by brushing or washing,                            when performed (separate procedure)                           99153, Moderate sedation; each additional 15                            minutes  intraservice time                           99153, Moderate sedation; each additional 15                            minutes intraservice time                           G0500, Moderate sedation services provided by the                            same physician or other qualified health care                            professional performing a gastrointestinal                            endoscopic service that sedation supports,                            requiring the presence of an independent trained                            observer to assist in the monitoring of the                            patient's level of consciousness and physiological                            status; initial 15 minutes of intra-service time;                            patient age 69 years or older (additional time may                            be reported with [phone], as appropriate) Diagnosis Code(s):        --- Professional ---                           Z12.11, Encounter for screening for malignant                            neoplasm of colon CPT copyright 2019 American Medical Association.  All rights reserved. The codes documented in this report are preliminary and upon coder review may  be revised to meet current compliance requirements. Hildred Laser, MD Hildred Laser, MD 05/11/2019 10:10:25 AM This report has been signed electronically. Number of Addenda: 0

## 2019-05-30 ENCOUNTER — Encounter: Payer: Self-pay | Admitting: Certified Nurse Midwife

## 2019-10-20 DIAGNOSIS — L821 Other seborrheic keratosis: Secondary | ICD-10-CM | POA: Diagnosis not present

## 2019-10-20 DIAGNOSIS — L814 Other melanin hyperpigmentation: Secondary | ICD-10-CM | POA: Diagnosis not present

## 2019-10-20 DIAGNOSIS — H43813 Vitreous degeneration, bilateral: Secondary | ICD-10-CM | POA: Diagnosis not present

## 2019-10-20 DIAGNOSIS — D2272 Melanocytic nevi of left lower limb, including hip: Secondary | ICD-10-CM | POA: Diagnosis not present

## 2019-10-20 DIAGNOSIS — D485 Neoplasm of uncertain behavior of skin: Secondary | ICD-10-CM | POA: Diagnosis not present

## 2019-10-20 DIAGNOSIS — H26492 Other secondary cataract, left eye: Secondary | ICD-10-CM | POA: Diagnosis not present

## 2019-10-20 DIAGNOSIS — L570 Actinic keratosis: Secondary | ICD-10-CM | POA: Diagnosis not present

## 2019-10-20 DIAGNOSIS — L820 Inflamed seborrheic keratosis: Secondary | ICD-10-CM | POA: Diagnosis not present

## 2019-10-20 DIAGNOSIS — D1801 Hemangioma of skin and subcutaneous tissue: Secondary | ICD-10-CM | POA: Diagnosis not present

## 2019-10-20 DIAGNOSIS — Z961 Presence of intraocular lens: Secondary | ICD-10-CM | POA: Diagnosis not present

## 2019-10-20 DIAGNOSIS — L918 Other hypertrophic disorders of the skin: Secondary | ICD-10-CM | POA: Diagnosis not present

## 2019-10-30 DIAGNOSIS — H26491 Other secondary cataract, right eye: Secondary | ICD-10-CM | POA: Diagnosis not present

## 2019-11-03 DIAGNOSIS — Z79899 Other long term (current) drug therapy: Secondary | ICD-10-CM | POA: Diagnosis not present

## 2019-11-03 DIAGNOSIS — N183 Chronic kidney disease, stage 3 unspecified: Secondary | ICD-10-CM | POA: Diagnosis not present

## 2019-11-03 DIAGNOSIS — K580 Irritable bowel syndrome with diarrhea: Secondary | ICD-10-CM | POA: Diagnosis not present

## 2019-11-14 DIAGNOSIS — N183 Chronic kidney disease, stage 3 unspecified: Secondary | ICD-10-CM | POA: Diagnosis not present

## 2019-11-14 DIAGNOSIS — R070 Pain in throat: Secondary | ICD-10-CM | POA: Diagnosis not present

## 2019-11-14 DIAGNOSIS — Z0001 Encounter for general adult medical examination with abnormal findings: Secondary | ICD-10-CM | POA: Diagnosis not present

## 2019-11-14 DIAGNOSIS — Z6820 Body mass index (BMI) 20.0-20.9, adult: Secondary | ICD-10-CM | POA: Diagnosis not present

## 2019-11-14 DIAGNOSIS — E213 Hyperparathyroidism, unspecified: Secondary | ICD-10-CM | POA: Diagnosis not present

## 2019-11-21 ENCOUNTER — Other Ambulatory Visit (HOSPITAL_COMMUNITY): Payer: Self-pay | Admitting: Internal Medicine

## 2019-11-21 DIAGNOSIS — Z1231 Encounter for screening mammogram for malignant neoplasm of breast: Secondary | ICD-10-CM

## 2019-12-15 ENCOUNTER — Ambulatory Visit: Payer: Medicare Other | Admitting: Certified Nurse Midwife

## 2019-12-18 ENCOUNTER — Other Ambulatory Visit: Payer: Self-pay

## 2019-12-18 ENCOUNTER — Ambulatory Visit: Payer: Medicare Other | Admitting: Obstetrics & Gynecology

## 2019-12-18 ENCOUNTER — Ambulatory Visit (HOSPITAL_COMMUNITY)
Admission: RE | Admit: 2019-12-18 | Discharge: 2019-12-18 | Disposition: A | Discharge status: Home / Self Care | Payer: Medicare PPO | Source: Ambulatory Visit | Attending: Internal Medicine | Admitting: Internal Medicine

## 2019-12-18 DIAGNOSIS — E213 Hyperparathyroidism, unspecified: Secondary | ICD-10-CM | POA: Diagnosis not present

## 2019-12-18 DIAGNOSIS — Z1231 Encounter for screening mammogram for malignant neoplasm of breast: Secondary | ICD-10-CM | POA: Insufficient documentation

## 2019-12-18 DIAGNOSIS — Z780 Asymptomatic menopausal state: Secondary | ICD-10-CM | POA: Diagnosis not present

## 2019-12-18 DIAGNOSIS — R29890 Loss of height: Secondary | ICD-10-CM | POA: Diagnosis not present

## 2019-12-18 DIAGNOSIS — Z23 Encounter for immunization: Secondary | ICD-10-CM | POA: Diagnosis not present

## 2019-12-18 DIAGNOSIS — Z8739 Personal history of other diseases of the musculoskeletal system and connective tissue: Secondary | ICD-10-CM | POA: Diagnosis not present

## 2019-12-29 NOTE — Progress Notes (Signed)
69 y.o. [id number] Married White or Caucasian female here for breast and pelvic exam. Denies vaginal bleeding.  Had recent BMD that showed osteoporosis in her forearm.  Started Fosamax.  Has a lot of vaginal thinning and tenderness.  She's used OTC products.  Did use vaginal estrogen in the past and this didn't seem to help.  Options dicussed today.  Would like to try something different to help with vaginal symptoms.  Patient's last menstrual period was 01/07/1998.          Sexually active: Yes.    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  Just started fosamax.   Last pap smear: 12-03-15 neg HPV HR + 16 18/45 neg, 12-03-16 neg HPV HR neg,  12-09-17 neg HPV HR neg  H/o abnormal pap smear:  yes   reports that she has never smoked. She has never used smokeless tobacco. She reports current alcohol use of about 1.0 standard drink of alcohol per week. She reports that she does not use drugs.  Past Medical History:  Diagnosis Date  . Abnormal Pap smear of cervix    12-03-15 neg HPV HR+, 16/18 neg  . IBS (irritable bowel syndrome)   . Osteopenia     Past Surgical History:  Procedure Laterality Date  . BUNIONECTOMY     left & right  . CATARACT EXTRACTION, BILATERAL    . COLONOSCOPY N/A 05/11/2019   Procedure: COLONOSCOPY;  Surgeon: Malissa Hippo, MD;  Location: [facility];  Service: Endoscopy;  Laterality: N/A;  930  . THYROIDECTOMY, PARTIAL  2001    Current Outpatient Medications  Medication Sig Dispense Refill  . alendronate (FOSAMAX) 70 MG tablet     . Glycerin-Hypromellose-PEG 400 (DRY EYE RELIEF DROPS) 0.2-0.2-1 %  SOLN Place 1 drop into both eyes daily as needed (Dry eyes).    . Methylcellulose, Laxative, (CITRUCEL PO) Take 2 tablets by mouth daily.     . Multiple Vitamins-Minerals (MULTIVITAMIN PO) Take 1 tablet by mouth daily. Centrum Silver     No current facility-administered medications for this visit.    Family History  Problem Relation Age of Onset  . Cancer Father        prostate  . Parkinson's disease Father   . Other Brother        blood clot  . Colon cancer Other   . Colon cancer Paternal Aunt     Review of Systems  Constitutional: Negative.   HENT: Negative.   Eyes: Negative.   Respiratory: Negative.   Cardiovascular: Negative.   Gastrointestinal: Negative.   Endocrine: Negative.   Genitourinary: Negative.   Musculoskeletal: Negative.   Skin: Negative.   Allergic/Immunologic: Negative.   Neurological: Negative.   Hematological: Negative.   Psychiatric/Behavioral: Negative.     Exam:   Ht 5' 1.5" (1.562 m)   Wt 108 lb (49 kg)   LMP 01/07/1998   BMI 20.08 kg/m   Height: 5' 1.5" (156.2 cm)  General appearance: alert, cooperative and appears stated age Breasts: normal appearance, no masses or tenderness Abdomen: soft, non-tender; bowel sounds normal; no masses,  no organomegaly Lymph nodes: Cervical, supraclavicular, and axillary nodes normal.  No abnormal inguinal nodes palpated Neurologic: Grossly normal  Pelvic: External genitalia:  no lesions              Urethra:  normal appearing urethra with no masses, tenderness or lesions              Bartholins and Skenes: normal                 Vagina: normal appearing vagina with significant atrophic changes, no discharge, no lesions              Cervix: no lesions              Pap taken: No. Bimanual Exam:  Uterus:  normal size, contour, position, consistency, mobility, non-tender              Adnexa: normal adnexa and no mass, fullness, tenderness               Rectovaginal: Confirms               Anus:  normal  sphincter tone, no lesions  Chaperone, Delton Coombes, CMA, was present for exam.  A:  Breast and Pelvic exam H/o + HR HPV 2017, neg 16/18/45 testing (neg pap and neg HR HPV 2018 and 2019) Vaginal atrophic changes Osteoporosis in forearms  P:   Mammogram guidelines reviewed.  Doing yearly. pap smear and HR HPV not indicated but would be due next year by current ASCCP guidelines.  Algorithm reviewed with pt today. Will try Vit E vaginal suppositories, 200U/ml, one pv two to three times weekly.  #24/3RF Lab work and vaccines, osteoporosis managed by Dr. Ouida Sills Colonoscopy 05/2019. return one year for breast/pelvic/cervical cancer screening.  29 minutes of total time was spent for this patient encounter, including preparation, face-to-face counseling with the patient and coordination of care, and documentation of the encounter.

## 2020-01-02 ENCOUNTER — Other Ambulatory Visit: Payer: Self-pay

## 2020-01-02 ENCOUNTER — Ambulatory Visit (INDEPENDENT_AMBULATORY_CARE_PROVIDER_SITE_OTHER): Payer: Medicare PPO | Admitting: Obstetrics & Gynecology

## 2020-01-02 ENCOUNTER — Encounter: Payer: Self-pay | Admitting: Obstetrics & Gynecology

## 2020-01-02 VITALS — BP 104/64 | HR 68 | Resp 16 | Ht 61.5 in | Wt 108.0 lb

## 2020-01-02 DIAGNOSIS — N952 Postmenopausal atrophic vaginitis: Secondary | ICD-10-CM

## 2020-01-02 DIAGNOSIS — Z01411 Encounter for gynecological examination (general) (routine) with abnormal findings: Secondary | ICD-10-CM

## 2020-01-02 DIAGNOSIS — Z8619 Personal history of other infectious and parasitic diseases: Secondary | ICD-10-CM | POA: Diagnosis not present

## 2020-01-02 MED ORDER — NONFORMULARY OR COMPOUNDED ITEM: 3 refills | Status: DC

## 2020-01-11 DIAGNOSIS — Z23 Encounter for immunization: Secondary | ICD-10-CM | POA: Diagnosis not present

## 2020-01-22 ENCOUNTER — Telehealth: Payer: Self-pay

## 2020-01-22 MED ORDER — NONFORMULARY OR COMPOUNDED ITEM: 3 refills | Status: DC

## 2020-01-22 NOTE — Telephone Encounter (Signed)
Patient seen 01-02-20 for AEX with Dr.Miller. Prescription sent to [facility] for Vit.E vaginal supp. Patient states it is going to cost $65 and Dr.Miller had states should be around $40. She was told of another compounding pharmacy in [place] and would like [place] to send Rx there because probably cheaper.  Advised would have to check with a provider and let her know. She wants Rx re-sent to [facility] on [facility].  Routed to Dr.Jertson

## 2020-01-22 NOTE — Telephone Encounter (Signed)
Patient is calling in regards to questions about prescription and pharmacies.

## 2020-01-22 NOTE — Telephone Encounter (Signed)
Order signed.

## 2020-01-23 NOTE — Telephone Encounter (Signed)
Rx reviewed and signed by Dr Oscar La. Rx faxed by clinical staff.  Encounter closed

## 2020-10-24 DIAGNOSIS — L820 Inflamed seborrheic keratosis: Secondary | ICD-10-CM | POA: Diagnosis not present

## 2020-10-24 DIAGNOSIS — L814 Other melanin hyperpigmentation: Secondary | ICD-10-CM | POA: Diagnosis not present

## 2020-10-24 DIAGNOSIS — D1801 Hemangioma of skin and subcutaneous tissue: Secondary | ICD-10-CM | POA: Diagnosis not present

## 2020-10-24 DIAGNOSIS — L918 Other hypertrophic disorders of the skin: Secondary | ICD-10-CM | POA: Diagnosis not present

## 2020-10-24 DIAGNOSIS — L821 Other seborrheic keratosis: Secondary | ICD-10-CM | POA: Diagnosis not present

## 2020-10-24 DIAGNOSIS — L570 Actinic keratosis: Secondary | ICD-10-CM | POA: Diagnosis not present

## 2020-10-30 DIAGNOSIS — H43813 Vitreous degeneration, bilateral: Secondary | ICD-10-CM | POA: Diagnosis not present

## 2020-10-30 DIAGNOSIS — Z961 Presence of intraocular lens: Secondary | ICD-10-CM | POA: Diagnosis not present

## 2020-11-04 DIAGNOSIS — K582 Mixed irritable bowel syndrome: Secondary | ICD-10-CM | POA: Diagnosis not present

## 2020-11-04 DIAGNOSIS — E213 Hyperparathyroidism, unspecified: Secondary | ICD-10-CM | POA: Diagnosis not present

## 2020-11-04 DIAGNOSIS — N183 Chronic kidney disease, stage 3 unspecified: Secondary | ICD-10-CM | POA: Diagnosis not present

## 2020-11-04 DIAGNOSIS — Z79899 Other long term (current) drug therapy: Secondary | ICD-10-CM | POA: Diagnosis not present

## 2020-11-18 ENCOUNTER — Other Ambulatory Visit (HOSPITAL_COMMUNITY): Payer: Self-pay | Admitting: Internal Medicine

## 2020-11-18 DIAGNOSIS — Z1231 Encounter for screening mammogram for malignant neoplasm of breast: Secondary | ICD-10-CM

## 2020-12-02 DIAGNOSIS — Z23 Encounter for immunization: Secondary | ICD-10-CM | POA: Diagnosis not present

## 2020-12-02 DIAGNOSIS — Z0000 Encounter for general adult medical examination without abnormal findings: Secondary | ICD-10-CM | POA: Diagnosis not present

## 2020-12-02 DIAGNOSIS — M810 Age-related osteoporosis without current pathological fracture: Secondary | ICD-10-CM | POA: Diagnosis not present

## 2020-12-02 DIAGNOSIS — K580 Irritable bowel syndrome with diarrhea: Secondary | ICD-10-CM | POA: Diagnosis not present

## 2020-12-02 DIAGNOSIS — N1831 Chronic kidney disease, stage 3a: Secondary | ICD-10-CM | POA: Diagnosis not present

## 2020-12-20 ENCOUNTER — Ambulatory Visit (HOSPITAL_COMMUNITY): Payer: Medicare PPO

## 2020-12-23 ENCOUNTER — Other Ambulatory Visit: Payer: Self-pay

## 2020-12-23 ENCOUNTER — Ambulatory Visit (HOSPITAL_COMMUNITY)
Admission: RE | Admit: 2020-12-23 | Discharge: 2020-12-23 | Disposition: A | Discharge status: Home / Self Care | Payer: Medicare PPO | Source: Ambulatory Visit | Attending: Internal Medicine | Admitting: Internal Medicine

## 2020-12-23 DIAGNOSIS — Z1231 Encounter for screening mammogram for malignant neoplasm of breast: Secondary | ICD-10-CM | POA: Insufficient documentation

## 2021-01-02 ENCOUNTER — Ambulatory Visit: Payer: Medicare PPO | Admitting: Nurse Practitioner

## 2021-02-24 ENCOUNTER — Other Ambulatory Visit: Payer: Self-pay

## 2021-02-24 ENCOUNTER — Ambulatory Visit (INDEPENDENT_AMBULATORY_CARE_PROVIDER_SITE_OTHER): Payer: Medicare PPO | Admitting: Nurse Practitioner

## 2021-02-24 ENCOUNTER — Encounter: Payer: Self-pay | Admitting: Nurse Practitioner

## 2021-02-24 ENCOUNTER — Other Ambulatory Visit (HOSPITAL_COMMUNITY)
Admission: RE | Admit: 2021-02-24 | Discharge: 2021-02-24 | Disposition: A | Discharge status: Home / Self Care | Payer: Medicare PPO | Source: Ambulatory Visit | Attending: Nurse Practitioner | Admitting: Nurse Practitioner

## 2021-02-24 VITALS — BP 118/78 | Ht 61.0 in | Wt 112.0 lb

## 2021-02-24 DIAGNOSIS — Z01419 Encounter for gynecological examination (general) (routine) without abnormal findings: Secondary | ICD-10-CM | POA: Diagnosis not present

## 2021-02-24 DIAGNOSIS — Z780 Asymptomatic menopausal state: Secondary | ICD-10-CM | POA: Diagnosis not present

## 2021-02-24 DIAGNOSIS — M810 Age-related osteoporosis without current pathological fracture: Secondary | ICD-10-CM

## 2021-02-24 DIAGNOSIS — N952 Postmenopausal atrophic vaginitis: Secondary | ICD-10-CM | POA: Diagnosis not present

## 2021-02-24 NOTE — Progress Notes (Signed)
Angelica Hicks 1950-12-30 [id number]   History:  70 y.o. [id number] presents for breast and pelvic exam. Postmenopausal - No HRT, no bleeding. Using Vitamin E suppositories for vaginal atrophy 3x weekly. 2017 normal cytology positive HR HPV negative 16/18/45, normal in 2018 and 2019.  On Fosamax since 12/2019 for osteoporosis, managed by PCP.   Gynecologic History Patient's last menstrual period was 01/07/1998.   Contraception: post menopausal status Sexually active: Yes  Health Maintenance Last Pap: 12/09/2017. Results were: Normal, 3-year repeat Last mammogram: 12/23/2020. Results were: Normal Last colonoscopy: 05/11/2019. Results were: Normal, 10-year recall Last Dexa: 12/18/2019. Results were: T-score -2.7 in right radius, osteopenic in all other sites  Past medical history, past surgical history, family history and social history were all reviewed and documented in the EPIC chart. Married. 2 children, one in [place], one in [place]. 5 grandchildren ages 80-14. 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   ROS:  A ROS was performed and pertinent positives and negatives are included.  Exam:  Vitals:   02/24/21 1325  BP: 118/78  Weight: 112 lb (50.8 kg)  Height: 5\' 1"  (1.549 m)   Body mass index is 21.16 kg/m.  General appearance:  Normal Thyroid:  Symmetrical, normal in size, without palpable masses or nodularity. Respiratory  Auscultation:  Clear without wheezing or rhonchi Cardiovascular  Auscultation:  Regular rate, without rubs, murmurs or gallops  Edema/varicosities:  Not grossly evident Abdominal  Soft,nontender, without masses, guarding or rebound.  Liver/spleen:  No organomegaly noted  Hernia:  None appreciated  Skin  Inspection:  Grossly normal Breasts: Examined lying and sitting.   Right: Without masses, retractions, nipple discharge or axillary adenopathy.   Left: Without masses, retractions, nipple discharge or axillary  adenopathy. Genitourinary   Inguinal/mons:  Normal without inguinal adenopathy  External genitalia:  Normal appearing vulva with no masses, tenderness, or lesions  BUS/Urethra/Skene's glands:  Normal  Vagina:  Normal appearing with normal color and discharge, no lesions. Atrophic changes  Cervix:  Normal appearing without discharge or lesions  Uterus:  Normal in size, shape and contour.  Midline and mobile, nontender  Adnexa/parametria:     Rt: Normal in size, without masses or tenderness.   Lt: Normal in size, without masses or tenderness.  Anus and perineum: Normal  Digital rectal exam: Normal sphincter tone without palpated masses or tenderness  Patient informed chaperone available to be present for breast and pelvic exam. Patient has requested no chaperone to be present. Patient has been advised what will be completed during breast and pelvic exam.   Assessment/Plan:  70 y.o. 75 for breast and pelvic exam.   Well female exam with routine gynecological exam - Plan: Cytology - PAP( [place]). Education provided on SBEs, importance of preventative screenings, current guidelines, high calcium diet, regular exercise, and multivitamin daily.  Labs with PCP.  Postmenopausal - no HRT, no bleeding.   Age-related osteoporosis without current pathological fracture - T-score -2.7 in right forearm, osteopenia in all other sites. On Fosamax since 12/2019, managed by PCP.   Atrophic vaginitis - Using Vitamin E suppositories 3x per week. Doing well on this and would like to continue.   Screening for cervical cancer - 2017 normal cytology positive HR HPV negative 16/18/45, normal in 2018 and 2019. Pap due today.   Screening for breast cancer - Normal mammogram history.  Continue annual screenings.  Normal breast exam today.  Screening for colon cancer - Normal colonoscopy in 2021. Will repeat at 10-year interval  per GI's recommendation.   Return in 2 years for breast and pelvic exam.    Olivia Mackie DNP, 1:43 PM 02/24/2021

## 2021-02-26 LAB — CYTOLOGY - PAP: Diagnosis: NEGATIVE

## 2021-04-25 ENCOUNTER — Other Ambulatory Visit (HOSPITAL_COMMUNITY): Payer: Self-pay | Admitting: Internal Medicine

## 2021-04-25 ENCOUNTER — Ambulatory Visit (HOSPITAL_COMMUNITY)
Admission: RE | Admit: 2021-04-25 | Discharge: 2021-04-25 | Disposition: A | Discharge status: Home / Self Care | Payer: Medicare PPO | Source: Ambulatory Visit | Attending: Internal Medicine | Admitting: Internal Medicine

## 2021-04-25 ENCOUNTER — Other Ambulatory Visit: Payer: Self-pay

## 2021-04-25 DIAGNOSIS — M545 Low back pain, unspecified: Secondary | ICD-10-CM

## 2021-05-23 ENCOUNTER — Other Ambulatory Visit: Payer: Self-pay

## 2021-05-23 ENCOUNTER — Ambulatory Visit (HOSPITAL_COMMUNITY): Payer: Medicare PPO | Attending: Internal Medicine | Admitting: Physical Therapy

## 2021-05-23 ENCOUNTER — Encounter (HOSPITAL_COMMUNITY): Payer: Self-pay | Admitting: Physical Therapy

## 2021-05-23 DIAGNOSIS — M6281 Muscle weakness (generalized): Secondary | ICD-10-CM | POA: Insufficient documentation

## 2021-05-23 DIAGNOSIS — M545 Low back pain, unspecified: Secondary | ICD-10-CM | POA: Diagnosis not present

## 2021-05-23 DIAGNOSIS — R29898 Other symptoms and signs involving the musculoskeletal system: Secondary | ICD-10-CM | POA: Insufficient documentation

## 2021-05-23 NOTE — Therapy (Signed)
?OUTPATIENT PHYSICAL THERAPY THORACOLUMBAR EVALUATION ? ? ?Patient Name: Angelica Hicks ?MRN: [id number] ?DOB:Aug 06, 1950, 71 y.o., female ?Today's Date: 05/23/2021 ? ? PT End of Session - 05/23/21 1032   ? ? Visit Number 1   ? Number of Visits 8   ? Date for PT Re-Evaluation 06/20/21   ? Authorization Type [employer] Medicare (check auth)   ? PT Start Time [phone]   ? PT Stop Time 1030   ? PT Time Calculation (min) 42 min   ? Activity Tolerance Patient tolerated treatment well   ? Behavior During Therapy [facility] for tasks assessed/performed   ? ?  ?  ? ?  ? ? ?Past Medical History:  ?Diagnosis Date  ? Abnormal Pap smear of cervix   ? 12-03-15 neg HPV HR+, 16/18 neg  ? IBS (irritable bowel syndrome)   ? Osteopenia   ? ?Past Surgical History:  ?Procedure Laterality Date  ? BUNIONECTOMY    ? left & right  ? CATARACT EXTRACTION, BILATERAL    ? COLONOSCOPY N/A 05/11/2019  ? Procedure: COLONOSCOPY;  Surgeon: Malissa Hippo, MD;  [facility];  Service: Endoscopy;  Laterality: N/A;  930  ? THYROIDECTOMY, PARTIAL  2001  ? ?Patient Active Problem List  ? Diagnosis Date Noted  ? Special screening for malignant neoplasms, colon 01/11/2019  ? Family hx of colon cancer 01/11/2019  ? ? ?PCP: Carylon Perches, MD ? ?REFERRING PROVIDER: Carylon Perches, MD ? ?REFERRING DIAG:  LBP  ? ?THERAPY DIAG:  ?Low back pain, unspecified back pain laterality, unspecified chronicity, unspecified whether sciatica present ? ?ONSET DATE: Within last year  ? ?SUBJECTIVE:                                                                                                                                                                                          ? ?SUBJECTIVE STATEMENT: ?Patient presents to therapy with complaint of LBP. She says since she retired she has become more active. She has noticed increased back pain, especially in the morning. She takes Tylenol PRN for pain. Occasionally has sharp pains. She does have Hx of osteopenia.  ?PERTINENT HISTORY:   ?Osteopenia  ? ?PAIN:  ?Are you having pain? Yes: NPRS scale: 2/10 ?Pain location: low back  ?Pain description: dull, aching  ?Aggravating factors: morning, turning in bed  ?Relieving factors: heat, stretching, movement   ? ? ?PRECAUTIONS: None ? ?WEIGHT BEARING RESTRICTIONS No ? ?FALLS:  ?Has patient fallen in last 6 months? No, Number of falls:  ? ?LIVING ENVIRONMENT: ?Lives with: lives with their spouse ? ?OCCUPATION: Retired  ? ?PLOF: Independent ? ?PATIENT GOALS Figure out  what I can do to reduce pain ? ? ?OBJECTIVE:  ? ?DIAGNOSTIC FINDINGS:  ?IMPRESSION: ?Degenerative change without acute abnormality. ?  ? ?PATIENT SURVEYS:  ?FOTO 67% function ? ? ?COGNITION: ? Overall cognitive status: Within functional limits for tasks assessed   ?  ?SENSATION: ?WFL ? ? ?PALPATION: ?No notable tenderness notes  ? ?LUMBAR ROM:  ? ?Active  A/PROM  ?05/23/2021  ?Flexion WNL  ?Extension 50% limited   ?Right lateral flexion WNL  ?Left lateral flexion 15% limited   ?Right rotation   ?Left rotation   ? (Blank rows = not tested) ? ?LE MMT: ? ?MMT Right ?05/23/2021 Left ?05/23/2021  ?Hip flexion 4+/5 4+/5  ?Hip extension 4/5 4/5  ?Hip abduction 4/5 4/5  ?Hip adduction    ?Hip internal rotation    ?Hip external rotation    ?Knee flexion    ?Knee extension 4/5 4/5  ?Ankle dorsiflexion 5/5 5/5  ?Ankle plantarflexion    ?Ankle inversion    ?Ankle eversion    ? (Blank rows = not tested) ? ?LUMBAR SPECIAL TESTS:  ?Straight leg raise test: Negative ? ? ? ?GAIT: ?Distance walked: [facility] ?Assistive device utilized: None ?Level of assistance: Complete Independence ? ? ? ? ?TODAY'S TREATMENT  ?05/23/21 ?Ab set ?Ab march Lv1 and lv 2 ?Bridge ?Windshield wiper ? ? ?PATIENT EDUCATION:  ?Education details: on evaluation findings, POC and HEP  ?Person educated: Patient ?Education method: Explanation and Demonstration ?Education comprehension: verbalized understanding and returned demonstration ? ? ?HOME EXERCISE PROGRAM: ?Access Code: [email or web address] ?URL:  [email or web address] ?Date: 05/23/2021 ?Prepared by: Georges Lynch ? ?Exercises ?Supine Transversus Abdominis Bracing - Hands on Stomach - 2-3 x daily - 7 x weekly - 1-2 sets - 10 reps - 5 second hold ?Supine March - 2-3 x daily - 7 x weekly - 1-2 sets - 10 reps ?Supine Bridge - 2-3 x daily - 7 x weekly - 1-2 sets - 10 reps - 5 second hold ?Supine Hip Internal and External Rotation - 2-3 x daily - 7 x weekly - 1 sets - 10 reps - 5 second hold ? ? ?ASSESSMENT: ? ?CLINICAL IMPRESSION: ?Patient is a 71 y.o. female who presents to physical therapy with complaint of LBP. Patient demonstrates muscle weakness, reduced ROM, and fascial restrictions which are likely contributing to symptoms of pain and are negatively impacting patient ability to perform ADLs and functional mobility tasks. Patient will benefit from skilled physical therapy services to address these deficits to reduce pain and improve level of function with ADLs and functional mobility tasks. ? ? ? ?OBJECTIVE IMPAIRMENTS decreased activity tolerance, decreased mobility, decreased ROM, decreased strength, increased fascial restrictions, improper body mechanics, and pain.  ? ?ACTIVITY LIMITATIONS cleaning, community activity, laundry, yard work, shopping, and yard work.  ? ?PERSONAL FACTORS Age are also affecting patient's functional outcome.  ? ? ?REHAB POTENTIAL: Good ? ?CLINICAL DECISION MAKING: Stable/uncomplicated ? ?EVALUATION COMPLEXITY: Low ? ? ?GOALS: ? ?SHORT TERM GOALS: Target date: 06/06/2021 ? ?Patient will be independent with initial HEP and self-management strategies to improve functional outcomes ?Baseline:  ?Goal status: INITIAL  ? ? ?LONG TERM GOALS: Target date: 06/20/2021 ? ?Patient will be independent with advanced HEP and self-management strategies to improve functional outcomes ?Baseline:  ?Goal status: INITIAL ? ?2.  Patient will improve FOTO score to predicted value to indicate improvement in functional outcomes ?Baseline:   ?Goal status: INITIAL ? ?3.  Patient will report reduction of back pain to 0/10 at rest for improved quality of life and  ability to perform ADLs  ?Baseline:  ?Goal status: INITIAL ? ?4. Patient will have equal to or > 4+/5 MMT throughout BLE to improve ability to perform functional mobility, stair ambulation and ADLs.  ?Baseline:  ?Goal status: INITIAL ? ?PLAN: ?PT FREQUENCY: 1-2x/week ? ?PT DURATION: 4 weeks ? ?PLANNED INTERVENTIONS: Therapeutic exercises, Therapeutic activity, Neuromuscular re-education, Balance training, Gait training, Patient/Family education, Joint manipulation, Joint mobilization, Stair training, Aquatic Therapy, Dry Needling, Electrical stimulation, Spinal manipulation, Spinal mobilization, Cryotherapy, Moist heat, scar mobilization, Taping, Traction, Ultrasound, Biofeedback, Ionotophoresis 4mg /ml Dexamethasone, and Manual therapy. ? ? ?PLAN FOR NEXT SESSION: Review HEP. Progress hip and core strength as tolerated. Lumbar mobility. Weekly HEP updated.  ? ?10:33 AM, 05/23/21 ?Georges Lynchameron Bookert Guzzi PT DPT  ?Physical Therapist with [place]  ?[facility]  ?[phone] ? ? ?

## 2021-06-05 ENCOUNTER — Ambulatory Visit (HOSPITAL_COMMUNITY): Payer: Medicare PPO

## 2021-06-05 DIAGNOSIS — M545 Low back pain, unspecified: Secondary | ICD-10-CM

## 2021-06-05 DIAGNOSIS — M6281 Muscle weakness (generalized): Secondary | ICD-10-CM | POA: Diagnosis not present

## 2021-06-05 DIAGNOSIS — R29898 Other symptoms and signs involving the musculoskeletal system: Secondary | ICD-10-CM | POA: Diagnosis not present

## 2021-06-05 NOTE — Therapy (Signed)
?OUTPATIENT PHYSICAL THERAPY TREATMENT NOTE ? ? ?Patient Name: Angelica Hicks ?MRN: [id number] ?DOB:09-06-1950, 71 y.o., female ?Today's Date: 06/05/2021 ? ?PCP: Asencion Noble, MD ?REFERRING PROVIDER: Asencion Noble, MD ? ? PT End of Session - 06/05/21 1206   ? ? Visit Number 2   ? Number of Visits 8   ? Date for PT Re-Evaluation 06/20/21   ? Authorization Type [employer] Medicare (check auth)   ? PT Start Time 1203   ? PT Stop Time [id number]   ? PT Time Calculation (min) 51 min   ? Activity Tolerance Patient tolerated treatment well   ? Behavior During Therapy [facility] for tasks assessed/performed   ? ?  ?  ? ?  ? ? ?Past Medical History:  ?Diagnosis Date  ? Abnormal Pap smear of cervix   ? 12-03-15 neg HPV HR+, 16/18 neg  ? IBS (irritable bowel syndrome)   ? Osteopenia   ? ?Past Surgical History:  ?Procedure Laterality Date  ? BUNIONECTOMY    ? left & right  ? CATARACT EXTRACTION, BILATERAL    ? COLONOSCOPY N/A 05/11/2019  ? Procedure: COLONOSCOPY;  Surgeon: Rogene Houston, MD;  [facility];  Service: Endoscopy;  Laterality: N/A;  930  ? THYROIDECTOMY, PARTIAL  2001  ? ?Patient Active Problem List  ? Diagnosis Date Noted  ? Special screening for malignant neoplasms, colon 01/11/2019  ? Family hx of colon cancer 01/11/2019  ? ? ?REFERRING DIAG: low back pain ? ?THERAPY DIAG:  ?Low back pain, unspecified back pain laterality, unspecified chronicity, unspecified whether sciatica present ? ?PERTINENT HISTORY: osteopenia ? ?PRECAUTIONS: none ? ?SUBJECTIVE: Patient states she cannot tell much difference in her back pain yet.  She has been compliant with HEP.  ? ?PAIN:  ?Are you having pain? Yes: NPRS scale: 5-6/10 ?Pain location: low back and sometimes off the the left side ?Pain description: low back ?Aggravating factors: mornings ?Relieving factors: hot shower, walking ? ? ? ?PCP: Asencion Noble, MD ?  ?REFERRING PROVIDER: Asencion Noble, MD ?  ?REFERRING DIAG:  LBP  ?  ?THERAPY DIAG:  ?Low back pain, unspecified back pain  laterality, unspecified chronicity, unspecified whether sciatica present ?  ?ONSET DATE: Within last year  ?  ?  ?PATIENT GOALS Figure out what I can do to reduce pain ?  ?  ?OBJECTIVE:  ?  ?DIAGNOSTIC FINDINGS:  ?IMPRESSION: ?Degenerative change without acute abnormality. ?  ?  ?PATIENT SURVEYS:  ?FOTO 67% function ?  ?  ?COGNITION: ?          Overall cognitive status: Within functional limits for tasks assessed               ?           ?SENSATION: ?WFL ?  ?  ?PALPATION: ?No notable tenderness notes  ?  ?LUMBAR ROM:  ?  ?Active  A/PROM  ?05/23/2021  ?Flexion WNL  ?Extension 50% limited   ?Right lateral flexion WNL  ?Left lateral flexion 15% limited   ?Right rotation    ?Left rotation    ? (Blank rows = not tested) ?  ?LE MMT: ?  ?MMT Right ?05/23/2021 Left ?05/23/2021  ?Hip flexion 4+/5 4+/5  ?Hip extension 4/5 4/5  ?Hip abduction 4/5 4/5  ?Hip adduction      ?Hip internal rotation      ?Hip external rotation      ?Knee flexion      ?Knee extension 4/5 4/5  ?Ankle dorsiflexion 5/5 5/5  ?  Ankle plantarflexion      ?Ankle inversion      ?Ankle eversion      ? (Blank rows = not tested) ?  ?LUMBAR SPECIAL TESTS:  ?Straight leg raise test: Negative ?  ?  ?  ?GAIT: ?Distance walked: [facility] ?Assistive device utilized: None ?Level of assistance: Complete Independence ?  ?  ?  ?  ?TODAY'S TREATMENT  ?06/05/21 ?Ab set 5" x 10 ?Ab march 5" x 10  ?Bridge x 10  ?Windshield wiper x 10 ?Prone glute sets x 10,  5" hold ?Prone hip ext x 10 ?Prone alternate arms x 10 ?Prone lying with hip shift to the Right x 5 min ? ? ?05/23/21 ?Ab set ?Ab march Lv1 and lv 2 ?Bridge ?Windshield wiper ?  ?  ?PATIENT EDUCATION:  ? ?06/05/21 ?Access Code: [email or web address] ?URL: [email or web address] ?Date: 06/05/2021 ?Prepared by: [facility] ? ?Exercises ?- Prone Gluteal Sets  - 2-3 x daily - 7 x weekly - 1 sets - 10 reps ?- Lying Prone  - 2-3 x daily - 7 x weekly - 1 sets - 1 reps - 5 min hold ?- Prone Hip Extension  - 2-3 x daily - 7 x weekly - 1 sets  - 10 reps ?- Prone Alternating Arm and Leg Lifts  - 1 x daily - 7 x weekly - 3 sets - 10 reps ?- Seated Correct Posture  - 1 x daily - 7 x weekly - 1 sets - 1 reps ?Education details: on evaluation findings, POC and HEP  ?Person educated: Patient ?Education method: Explanation and Demonstration ?Education comprehension: verbalized understanding and returned demonstration ?  ?  ?HOME EXERCISE PROGRAM: ?Access Code: [email or web address] ?URL: [email or web address] ?Date: 05/23/2021 ?Prepared by: Josue Hector ?  ?Exercises ?Supine Transversus Abdominis Bracing - Hands on Stomach - 2-3 x daily - 7 x weekly - 1-2 sets - 10 reps - 5 second hold ?Supine March - 2-3 x daily - 7 x weekly - 1-2 sets - 10 reps ?Supine Bridge - 2-3 x daily - 7 x weekly - 1-2 sets - 10 reps - 5 second hold ?Supine Hip Internal and External Rotation - 2-3 x daily - 7 x weekly - 1 sets - 10 reps - 5 second hold ?  ?  ?ASSESSMENT: ?  ?CLINICAL IMPRESSION: ?Today's session focused on review of patient's goals and HEP. Left hip flexion MMT 4/5 today Pain with continued pain with lumbar  Left lateral flexion and extension; Patient did get some relief with prone lying with hip shift to the Right and improved lumbar extension after treatment. Progressed core strengthening exercises and HEP. Patient will benefit from continued skilled physical therapy services to address these deficits to reduce pain and improve level of function with ADLs and functional mobility tasks. ?  ?  ?  ?OBJECTIVE IMPAIRMENTS decreased activity tolerance, decreased mobility, decreased ROM, decreased strength, increased fascial restrictions, improper body mechanics, and pain.  ?  ?ACTIVITY LIMITATIONS cleaning, community activity, laundry, yard work, shopping, and yard work.  ?  ?PERSONAL FACTORS Age are also affecting patient's functional outcome.  ?  ?  ?REHAB POTENTIAL: Good ?  ?CLINICAL DECISION MAKING: Stable/uncomplicated ?  ?EVALUATION COMPLEXITY: Low ?  ?  ?GOALS: ?   ?SHORT TERM GOALS: Target date: 06/06/2021 ?  ?Patient will be independent with initial HEP and self-management strategies to improve functional outcomes ?Baseline:  ?Goal status: INITIAL  ?  ?  ?LONG TERM GOALS: Target date: 06/20/2021 ?  ?Patient will be independent  with advanced HEP and self-management strategies to improve functional outcomes ?Baseline:  ?Goal status: INITIAL ?  ?2.  Patient will improve FOTO score to predicted value to indicate improvement in functional outcomes ?Baseline:  ?Goal status: INITIAL ?  ?3.  Patient will report reduction of back pain to 0/10 at rest for improved quality of life and ability to perform ADLs  ?Baseline:  ?Goal status: INITIAL ?  ?4. Patient will have equal to or > 4+/5 MMT throughout BLE to improve ability to perform functional mobility, stair ambulation and ADLs.  ?Baseline:  ?Goal status: INITIAL ?  ?PLAN: ?PT FREQUENCY: 1-2x/week ?  ?PT DURATION: 4 weeks ?  ?PLANNED INTERVENTIONS: Therapeutic exercises, Therapeutic activity, Neuromuscular re-education, Balance training, Gait training, Patient/Family education, Joint manipulation, Joint mobilization, Stair training, Aquatic Therapy, Dry Needling, Electrical stimulation, Spinal manipulation, Spinal mobilization, Cryotherapy, Moist heat, scar mobilization, Taping, Traction, Ultrasound, Biofeedback, Ionotophoresis 4mg /ml Dexamethasone, and Manual therapy. ?  ?  ?PLAN FOR NEXT SESSION:  Assess patient reaction to HEP; Progress hip and core strength as tolerated. Lumbar mobility.   ? ? ?12:57 PM, 06/05/21 ?Quan Cybulski Small Arlo Buffone MPT ?[place] [facility] ?[place] [phone] ?Ph:[phone] ? ? ?

## 2021-06-10 ENCOUNTER — Encounter (HOSPITAL_COMMUNITY): Payer: Self-pay | Admitting: Physical Therapy

## 2021-06-10 ENCOUNTER — Ambulatory Visit (HOSPITAL_COMMUNITY): Payer: Medicare PPO | Attending: Internal Medicine | Admitting: Physical Therapy

## 2021-06-10 DIAGNOSIS — M545 Low back pain, unspecified: Secondary | ICD-10-CM

## 2021-06-10 DIAGNOSIS — R29898 Other symptoms and signs involving the musculoskeletal system: Secondary | ICD-10-CM | POA: Diagnosis not present

## 2021-06-10 DIAGNOSIS — M6281 Muscle weakness (generalized): Secondary | ICD-10-CM | POA: Diagnosis not present

## 2021-06-10 NOTE — Therapy (Signed)
?OUTPATIENT PHYSICAL THERAPY TREATMENT NOTE ? ? ?Patient Name: Angelica Hicks ?MRN: [id number] ?DOB:June 14, 1950, 71 y.o., female ?Today's Date: 06/10/2021 ? ?PCP: Carylon Perches, MD ?REFERRING PROVIDER: Carylon Perches, MD ? ? PT End of Session - 06/10/21 1307   ? ? Visit Number 3   ? Number of Visits 8   ? Date for PT Re-Evaluation 06/20/21   ? Authorization Type [employer]   ? Authorization Time Period 10 visits 3/17-4/14/23   ? Authorization - Number of Visits 3   ? Progress Note Due on Visit 10   ? PT Start Time 1305   ? PT Stop Time 1350   ? PT Time Calculation (min) 45 min   ? Activity Tolerance Patient tolerated treatment well   ? Behavior During Therapy [facility] for tasks assessed/performed   ? ?  ?  ? ?  ? ? ?Past Medical History:  ?Diagnosis Date  ? Abnormal Pap smear of cervix   ? 12-03-15 neg HPV HR+, 16/18 neg  ? IBS (irritable bowel syndrome)   ? Osteopenia   ? ?Past Surgical History:  ?Procedure Laterality Date  ? BUNIONECTOMY    ? left & right  ? CATARACT EXTRACTION, BILATERAL    ? COLONOSCOPY N/A 05/11/2019  ? Procedure: COLONOSCOPY;  Surgeon: Malissa Hippo, MD;  [facility];  Service: Endoscopy;  Laterality: N/A;  930  ? THYROIDECTOMY, PARTIAL  2001  ? ?Patient Active Problem List  ? Diagnosis Date Noted  ? Special screening for malignant neoplasms, colon 01/11/2019  ? Family hx of colon cancer 01/11/2019  ? ? ?REFERRING DIAG: low back pain ? ?THERAPY DIAG:  ?Low back pain, unspecified back pain laterality, unspecified chronicity, unspecified whether sciatica present ? ?PERTINENT HISTORY: osteopenia ? ?PRECAUTIONS: none ? ?SUBJECTIVE: No noticeable effect of exercises yet. This morning was the worst as far as pain and stiffness.  ? ?PAIN:  ?Are you having pain? Yes: NPRS scale: 7/10 ?Pain location: low back and sometimes off the the left side ?Pain description: low back ?Aggravating factors: mornings ?Relieving factors: hot shower, walking ? ? ? ?PCP: Carylon Perches, MD ?  ?REFERRING PROVIDER:  Carylon Perches, MD ?  ?REFERRING DIAG:  LBP  ?  ?THERAPY DIAG:  ?Low back pain, unspecified back pain laterality, unspecified chronicity, unspecified whether sciatica present ?  ?ONSET DATE: Within last year  ?  ?  ?PATIENT GOALS Figure out what I can do to reduce pain ?  ?  ?OBJECTIVE:  ?  ?DIAGNOSTIC FINDINGS:  ?IMPRESSION: ?Degenerative change without acute abnormality. ?  ?  ?PATIENT SURVEYS:  ?FOTO 67% function ?  ?  ?COGNITION: ?          Overall cognitive status: Within functional limits for tasks assessed               ?           ?SENSATION: ?WFL ?  ?  ?PALPATION: ?No notable tenderness notes  ?  ?LUMBAR ROM:  ?  ?Active  A/PROM  ?05/23/2021  ?Flexion WNL  ?Extension 50% limited   ?Right lateral flexion WNL  ?Left lateral flexion 15% limited   ?Right rotation    ?Left rotation    ? (Blank rows = not tested) ?  ?LE MMT: ?  ?MMT Right ?05/23/2021 Left ?05/23/2021  ?Hip flexion 4+/5 4+/5  ?Hip extension 4/5 4/5  ?Hip abduction 4/5 4/5  ?Hip adduction      ?Hip internal rotation      ?Hip  external rotation      ?Knee flexion      ?Knee extension 4/5 4/5  ?Ankle dorsiflexion 5/5 5/5  ?Ankle plantarflexion      ?Ankle inversion      ?Ankle eversion      ? (Blank rows = not tested) ?  ?LUMBAR SPECIAL TESTS:  ?Straight leg raise test: Negative ?  ?  ?  ?GAIT: ?[facility] ?Assistive device utilized: None ?Level of assistance: Complete Independence ?  ?  ?  ?  ?TODAY'S TREATMENT  ? ?06/10/21 ?Ab marching x20 ?Dead bug 2 x10 ?Bridge 2 x10  ?Clamshell x 10 each  ?Sidelying hip abduction 2 x 10  ?Windshield wiper x10 with 5 second hold ?SKTC stretch 5 x 10" each  ? ?06/05/21 ?Ab set 5" x 10 ?Ab march 5" x 10  ?Bridge x 10  ?Windshield wiper x 10 ?Prone glute sets x 10,  5" hold ?Prone hip ext x 10 ?Prone alternate arms x 10 ?Prone lying with hip shift to the Right x 5 min ? ? ?05/23/21 ?Ab set ?Ab march Lv1 and lv 2 ?Bridge ?Windshield wiper ?  ?  ?PATIENT EDUCATION:  ? ?On exercise form and function  ?  ?HOME EXERCISE  PROGRAM: ?Access Code: [email or web address] ?URL: [email or web address] ?Date: 06/10/2021 ?Prepared by: Georges Lynchameron Hao Dion ? ?Exercises ?- Supine Hip Internal and External Rotation  - 2 x daily - 7 x weekly - 1 sets - 10 reps - 5-10 second hold ?- Supine Single Knee to Chest Stretch  - 2 x daily - 7 x weekly - 1 sets - 10 reps - 5-10 second hold ?- Supine March  - 2 x daily - 7 x weekly - 1 sets - 10 reps ?- Supine Dead Bug with Leg Extension  - 2 x daily - 7 x weekly - 2 sets - 10 reps ?- Supine Bridge  - 2 x daily - 7 x weekly - 2 sets - 10 reps ?- Sidelying Hip Abduction  - 2 x daily - 7 x weekly - 2 sets - 10 reps ?- Prone Hip Extension  - 2 x daily - 7 x weekly - 2 sets - 10 reps ?  ?ASSESSMENT: ?  ?CLINICAL IMPRESSION: ?Patient tolerated session well. Continued on HEP development. Added hip abduction for LE strength progression and SKTC for lumbar mobility. Patient required verbal cues for proper form during dead bugs and for set count during hip bridge to reduce lumbar strain. Issued updated HEP handout. Patient will continue to benefit from skilled therapy services to reduce remaining deficits and improve functional ability.  ? ?  ?OBJECTIVE IMPAIRMENTS decreased activity tolerance, decreased mobility, decreased ROM, decreased strength, increased fascial restrictions, improper body mechanics, and pain.  ?  ?ACTIVITY LIMITATIONS cleaning, community activity, laundry, yard work, shopping, and yard work.  ?  ?PERSONAL FACTORS Age are also affecting patient's functional outcome.  ?  ?  ?REHAB POTENTIAL: Good ?  ?CLINICAL DECISION MAKING: Stable/uncomplicated ?  ?EVALUATION COMPLEXITY: Low ?  ?  ?GOALS: ?  ?SHORT TERM GOALS: Target date: 06/06/2021 ?  ?Patient will be independent with initial HEP and self-management strategies to improve functional outcomes ?Baseline:  ?Goal status: INITIAL  ?  ?  ?LONG TERM GOALS: Target date: 06/20/2021 ?  ?Patient will be independent with advanced HEP and self-management  strategies to improve functional outcomes ?Baseline:  ?Goal status: INITIAL ?  ?2.  Patient will improve FOTO score to predicted value to indicate improvement in functional outcomes ?Baseline:  ?  Goal status: INITIAL ?  ?3.  Patient will report reduction of back pain to 0/10 at rest for improved quality of life and ability to perform ADLs  ?Baseline:  ?Goal status: INITIAL ?  ?4. Patient will have equal to or > 4+/5 MMT throughout BLE to improve ability to perform functional mobility, stair ambulation and ADLs.  ?Baseline:  ?Goal status: INITIAL ?  ?PLAN: ?PT FREQUENCY: 1-2x/week ?  ?PT DURATION: 4 weeks ?  ?PLANNED INTERVENTIONS: Therapeutic exercises, Therapeutic activity, Neuromuscular re-education, Balance training, Gait training, Patient/Family education, Joint manipulation, Joint mobilization, Stair training, Aquatic Therapy, Dry Needling, Electrical stimulation, Spinal manipulation, Spinal mobilization, Cryotherapy, Moist heat, scar mobilization, Taping, Traction, Ultrasound, Biofeedback, Ionotophoresis 4mg /ml Dexamethasone, and Manual therapy. ?  ?  ?PLAN FOR NEXT SESSION:  Progress hip and core strength as tolerated. Lumbar mobility.   ? ?1:58 PM, 06/10/21 ?08/10/21 PT DPT  ?Physical Therapist with [place]  ?[facility]  ?[phone] ? ? ?

## 2021-06-12 ENCOUNTER — Encounter (HOSPITAL_COMMUNITY): Payer: Medicare PPO | Admitting: Physical Therapy

## 2021-06-17 ENCOUNTER — Encounter (HOSPITAL_COMMUNITY): Payer: Self-pay | Admitting: Physical Therapy

## 2021-06-17 ENCOUNTER — Ambulatory Visit (HOSPITAL_COMMUNITY): Payer: Medicare PPO | Admitting: Physical Therapy

## 2021-06-17 DIAGNOSIS — R29898 Other symptoms and signs involving the musculoskeletal system: Secondary | ICD-10-CM | POA: Diagnosis not present

## 2021-06-17 DIAGNOSIS — M545 Low back pain, unspecified: Secondary | ICD-10-CM

## 2021-06-17 DIAGNOSIS — M6281 Muscle weakness (generalized): Secondary | ICD-10-CM | POA: Diagnosis not present

## 2021-06-17 NOTE — Therapy (Signed)
?OUTPATIENT PHYSICAL THERAPY TREATMENT NOTE ? ? ?Patient Name: Angelica Hicks ?MRN: [id number] ?DOB:Nov 04, 1950, 71 y.o., female ?Today's Date: 06/17/2021 ? ?Progress Note ?Reporting Period 05/23/21 to 06/17/21 ? ?See note below for Objective Data and Assessment of Progress/Goals.  ? ?  ?PCP: Asencion Noble, MD ?REFERRING PROVIDER: Asencion Noble, MD ? ? PT End of Session - 06/17/21 1120   ? ? Visit Number 4   ? Number of Visits 8   ? Date for PT Re-Evaluation 07/15/21   ? Authorization Type [employer] (submitted for 4 more visits)   ? Authorization Time Period Check auth   ? Authorization - Number of Visits 4   ? Progress Note Due on Visit 10   ? PT Start Time 1117   ? PT Stop Time 1203   ? PT Time Calculation (min) 46 min   ? Activity Tolerance Patient tolerated treatment well   ? Behavior During Therapy [facility] for tasks assessed/performed   ? ?  ?  ? ?  ? ? ?Past Medical History:  ?Diagnosis Date  ? Abnormal Pap smear of cervix   ? 12-03-15 neg HPV HR+, 16/18 neg  ? IBS (irritable bowel syndrome)   ? Osteopenia   ? ?Past Surgical History:  ?Procedure Laterality Date  ? BUNIONECTOMY    ? left & right  ? CATARACT EXTRACTION, BILATERAL    ? COLONOSCOPY N/A 05/11/2019  ? Procedure: COLONOSCOPY;  Surgeon: Rogene Houston, MD;  [facility];  Service: Endoscopy;  Laterality: N/A;  930  ? THYROIDECTOMY, PARTIAL  2001  ? ?Patient Active Problem List  ? Diagnosis Date Noted  ? Special screening for malignant neoplasms, colon 01/11/2019  ? Family hx of colon cancer 01/11/2019  ? ? ?REFERRING DIAG: low back pain ? ?THERAPY DIAG:  ?Low back pain, unspecified back pain laterality, unspecified chronicity, unspecified whether sciatica present ? ?PERTINENT HISTORY: osteopenia ? ?PRECAUTIONS: none ? ?SUBJECTIVE: Updated HEP is somewhat more tolerance. Not as much pain as before, but still having increased pain in mornings. Today was a bad day. Noting elevated back pain this morning. Tried back stretching from HEP but with  little improvement.  ? ?PAIN:  ?Are you having pain? Yes: NPRS scale: 7/10 ?Pain location: low back  ?Pain description: low back ?Aggravating factors: mornings ?Relieving factors: hot shower, walking ? ? ? ?PCP: Asencion Noble, MD ?  ?REFERRING PROVIDER: Asencion Noble, MD ?  ?REFERRING DIAG:  LBP  ?  ?THERAPY DIAG:  ?Low back pain, unspecified back pain laterality, unspecified chronicity, unspecified whether sciatica present ?  ?ONSET DATE: Within last year  ?  ?  ?PATIENT GOALS Figure out what I can do to reduce pain ?  ?  ?OBJECTIVE:  ?  ?DIAGNOSTIC FINDINGS:  ?IMPRESSION: ?Degenerative change without acute abnormality. ?  ?  ?PATIENT SURVEYS:  ?FOTO 72% function ?  ?  ?COGNITION: ?          Overall cognitive status: Within functional limits for tasks assessed               ?           ?SENSATION: ?WFL ?  ?  ?PALPATION: ?No notable tenderness notes  ?  ?LUMBAR ROM:  ?  ?  ?LE MMT: ?  ?MMT Right ?05/23/2021 Left ?05/23/2021  ?Hip flexion 4+/5 4+/5  ?Hip extension 4/5 4/5  ?Hip abduction 4/5 4/5  ?Hip adduction      ?Hip internal rotation      ?Hip  external rotation      ?Knee flexion      ?Knee extension 4/5 4/5  ?Ankle dorsiflexion 5/5 5/5  ?Ankle plantarflexion      ?Ankle inversion      ?Ankle eversion      ? (Blank rows = not tested) ?  ?LUMBAR SPECIAL TESTS:  ?Straight leg raise test: Negative ?  ?  ?  ?GAIT: ?Distance walked: [facility] ?Assistive device utilized: None ?Level of assistance: Complete Independence ?  ?  ?  ?  ?TODAY'S TREATMENT  ? ?June 25, 2021 ?Reassess  ? ?Dead bug 2 x 10 ?Bird dogs x10  ?  ?LE MMT: ?  ?MMT Right ?05/23/2021 Right ?2021/06/25 Left ?05/23/2021 Left  ?2021-06-25  ?Hip flexion 4+/5 5 4+/5 5  ?Hip extension 4/5 4+ 4/5 4+  ?Hip abduction 4/5 4+ 4/5 4+  ?Hip adduction        ?Hip internal rotation        ?Hip external rotation        ?Knee flexion        ?Knee extension 4/5 4+ 4/5 4+  ?Ankle dorsiflexion 5/5  5/5   ?Ankle plantarflexion        ?Ankle inversion        ?Ankle eversion        ? (Blank rows = not  tested) ?  ? ?Jun 18, 2021 ?Ab marching x20 ?Dead bug 2 x10 ?Bridge 2 x10  ?Clamshell x 10 each  ?Sidelying hip abduction 2 x 10  ?Windshield wiper x10 with 5 second hold ?SKTC stretch 5 x 10" each  ? ?06/05/21 ?Ab set 5" x 10 ?Ab march 5" x 10  ?Bridge x 10  ?Windshield wiper x 10 ?Prone glute sets x 10,  5" hold ?Prone hip ext x 10 ?Prone alternate arms x 10 ?Prone lying with hip shift to the Right x 5 min ? ? ?05/23/21 ?Ab set ?Ab march Lv1 and lv 2 ?Bridge ?Windshield wiper ?  ?  ?PATIENT EDUCATION:  ? ?On exercise form and function  ?  ?HOME EXERCISE PROGRAM: ?Access Code: [email or web address] ?URL: [email or web address] ?Date: June 18, 2021 ?Prepared by: Josue Hector ? ?Exercises ?- Supine Hip Internal and External Rotation  - 2 x daily - 7 x weekly - 1 sets - 10 reps - 5-10 second hold ?- Supine Single Knee to Chest Stretch  - 2 x daily - 7 x weekly - 1 sets - 10 reps - 5-10 second hold ?- Supine March  - 2 x daily - 7 x weekly - 1 sets - 10 reps ?- Supine Dead Bug with Leg Extension  - 2 x daily - 7 x weekly - 2 sets - 10 reps ?- Supine Bridge  - 2 x daily - 7 x weekly - 2 sets - 10 reps ?- Sidelying Hip Abduction  - 2 x daily - 7 x weekly - 2 sets - 10 reps ?- Prone Hip Extension  - 2 x daily - 7 x weekly - 2 sets - 10 reps ?  ?ASSESSMENT: ?  ?CLINICAL IMPRESSION: ?Patient showing good progress to therapy goals. Significant improvement in MMT and continues to demo very good return with prior activity and exercise. She continues to be limited by moderate low back pain at baseline, and especially worse in the morning. Patient will continue to benefit form skilled therapy services for ongoing development of progressive HEP for core strength to reduce low back pain for improved LOF with ADLs. ? ?  ?OBJECTIVE IMPAIRMENTS decreased activity tolerance, decreased  mobility, decreased ROM, decreased strength, increased fascial restrictions, improper body mechanics, and pain.  ?  ?ACTIVITY LIMITATIONS cleaning, community  activity, laundry, yard work, shopping, and yard work.  ?  ?PERSONAL FACTORS Age are also affecting patient's functional outcome.  ?  ?  ?REHAB POTENTIAL: Good ?  ?CLINICAL DECISION MAKING: Stable/uncomplicated ?  ?EVALUATION COMPLEXITY: Low ?  ?  ?GOALS: ?  ?SHORT TERM GOALS: Target date: 06/06/2021 ?  ?Patient will be independent with initial HEP and self-management strategies to improve functional outcomes ?Baseline:  ?Goal status: MET  ?  ?  ?LONG TERM GOALS: Target date: 06/20/2021 ?  ?Patient will be independent with advanced HEP and self-management strategies to improve functional outcomes ?Baseline:  ?Goal status: ongoing ?  ?2.  Patient will improve FOTO score to predicted value to indicate improvement in functional outcomes ?Baseline: 72% function  ?Goal status: Ongoing ?  ?3.  Patient will report reduction of back pain to 0/10 at rest for improved quality of life and ability to perform ADLs  ?Baseline: 5-6/10 at rest  ?Goal status:  Ongoing  ?  ?4. Patient will have equal to or > 4+/5 MMT throughout BLE to improve ability to perform functional mobility, stair ambulation and ADLs.  ?Baseline:  ?Goal status: MET ?  ?PLAN: ?PT FREQUENCY: 1-2x/week ?  ?PT DURATION: 4 weeks ?  ?PLANNED INTERVENTIONS: Therapeutic exercises, Therapeutic activity, Neuromuscular re-education, Balance training, Gait training, Patient/Family education, Joint manipulation, Joint mobilization, Stair training, Aquatic Therapy, Dry Needling, Electrical stimulation, Spinal manipulation, Spinal mobilization, Cryotherapy, Moist heat, scar mobilization, Taping, Traction, Ultrasound, Biofeedback, Ionotophoresis 24m/ml Dexamethasone, and Manual therapy. ?  ?  ?PLAN FOR NEXT SESSION:  Plank, side plank, palloff press, palloff walkouts. Progress hip and core strength as tolerated. Lumbar mobility.   ? ? ?12:05 PM, 06/17/21 ?CJosue HectorPT DPT  ?Physical Therapist with [facility] ?[phone]? ? ?

## 2021-06-19 ENCOUNTER — Encounter (HOSPITAL_COMMUNITY): Payer: Medicare PPO | Admitting: Physical Therapy

## 2021-06-24 ENCOUNTER — Ambulatory Visit (HOSPITAL_COMMUNITY): Payer: Medicare PPO | Admitting: Physical Therapy

## 2021-06-24 ENCOUNTER — Encounter (HOSPITAL_COMMUNITY): Payer: Self-pay | Admitting: Physical Therapy

## 2021-06-24 DIAGNOSIS — M545 Low back pain, unspecified: Secondary | ICD-10-CM | POA: Diagnosis not present

## 2021-06-24 DIAGNOSIS — R29898 Other symptoms and signs involving the musculoskeletal system: Secondary | ICD-10-CM | POA: Diagnosis not present

## 2021-06-24 DIAGNOSIS — M6281 Muscle weakness (generalized): Secondary | ICD-10-CM | POA: Diagnosis not present

## 2021-06-24 NOTE — Therapy (Signed)
?OUTPATIENT PHYSICAL THERAPY TREATMENT NOTE ? ? ?Patient Name: Angelica Hicks ?MRN: [id number] ?DOB:[birth year], 71 y.o., female ?Today's Date: 06/24/2021 ? ?Progress Note ?Reporting Period 05/23/21 to 06/17/21 ? ?See note below for Objective Data and Assessment of Progress/Goals.  ? ?  ?PCP: Asencion Noble, MD ?REFERRING PROVIDER: Asencion Noble, MD ? ? PT End of Session - 06/24/21 1123   ? ? Visit Number 5   ? Number of Visits 8   ? Date for PT Re-Evaluation 07/15/21   ? Authorization Type [employer]   ? Authorization Time Period 4 visits 4/12-07/15/21   ? Authorization - Number of Visits 5   ? Progress Note Due on Visit 10   ? PT Start Time 1120   ? PT Stop Time 1200   ? PT Time Calculation (min) 40 min   ? Activity Tolerance Patient tolerated treatment well   ? Behavior During Therapy [facility] for tasks assessed/performed   ? ?  ?  ? ?  ? ? ?Past Medical History:  ?Diagnosis Date  ? Abnormal Pap smear of cervix   ? 12-03-15 neg HPV HR+, 16/18 neg  ? IBS (irritable bowel syndrome)   ? Osteopenia   ? ?Past Surgical History:  ?Procedure Laterality Date  ? BUNIONECTOMY    ? left & right  ? CATARACT EXTRACTION, BILATERAL    ? COLONOSCOPY N/A 05/11/2019  ? Procedure: COLONOSCOPY;  Surgeon: Rogene Houston, MD;  [facility];  Service: Endoscopy;  Laterality: N/A;  930  ? THYROIDECTOMY, PARTIAL  2001  ? ?Patient Active Problem List  ? Diagnosis Date Noted  ? Special screening for malignant neoplasms, colon 01/11/2019  ? Family hx of colon cancer 01/11/2019  ? ? ?REFERRING DIAG: low back pain ? ?THERAPY DIAG:  ?Low back pain, unspecified back pain laterality, unspecified chronicity, unspecified whether sciatica present ? ?PERTINENT HISTORY: osteopenia ? ?PRECAUTIONS: none ? ?SUBJECTIVE: Doing well today, less pain overall. Still stiff in morning. Has been compliant with HEP  ? ?PAIN:  ?Are you having pain? Yes: NPRS scale: 3/10 ?Pain location: low back  ?Pain description: low back ?Aggravating factors: mornings ?Relieving  factors: hot shower, walking ? ? ? ?PCP: Asencion Noble, MD ?  ?REFERRING PROVIDER: Asencion Noble, MD ?  ?REFERRING DIAG:  LBP  ?  ?THERAPY DIAG:  ?Low back pain, unspecified back pain laterality, unspecified chronicity, unspecified whether sciatica present ?  ?ONSET DATE: Within last year  ?  ?  ?PATIENT GOALS Figure out what I can do to reduce pain ?  ?  ?OBJECTIVE:  ?  ?DIAGNOSTIC FINDINGS:  ?IMPRESSION: ?Degenerative change without acute abnormality. ?  ?  ?PATIENT SURVEYS:  ?FOTO 72% function ?  ?  ?COGNITION: ?          Overall cognitive status: Within functional limits for tasks assessed               ?           ?SENSATION: ?WFL ?  ?  ?PALPATION: ?No notable tenderness notes  ?  ?LUMBAR ROM:  ?  ?  ?LE MMT: ?  ?MMT Right ?05/23/2021 Left ?05/23/2021  ?Hip flexion 4+/5 4+/5  ?Hip extension 4/5 4/5  ?Hip abduction 4/5 4/5  ?Hip adduction      ?Hip internal rotation      ?Hip external rotation      ?Knee flexion      ?Knee extension 4/5 4/5  ?Ankle dorsiflexion 5/5 5/5  ?Ankle plantarflexion      ?  Ankle inversion      ?Ankle eversion      ? (Blank rows = not tested) ?  ?LUMBAR SPECIAL TESTS:  ?Straight leg raise test: Negative ?  ?  ?  ?GAIT: ?Distance walked: [facility] ?Assistive device utilized: None ?Level of assistance: Complete Independence ?  ?  ?  ?  ?TODAY'S TREATMENT  ?06/24/21 ?Deadbugs 2 x10  ?Bridge 10 x5" ? Bridge with alternating knee extension x10  ? RTB resisted clamshell 2 x15 each  ? Plank series front/ side/ side 3 x 15" each  ? Palloff press RTB x 15 each ?  ? ?2021/07/03 ?Reassess  ? ?Dead bug 2 x 10 ?Bird dogs x10  ?  ?LE MMT: ?  ?MMT Right ?05/23/2021 Right ?Jul 03, 2021 Left ?05/23/2021 Left  ?07-03-2021  ?Hip flexion 4+/5 5 4+/5 5  ?Hip extension 4/5 4+ 4/5 4+  ?Hip abduction 4/5 4+ 4/5 4+  ?Hip adduction        ?Hip internal rotation        ?Hip external rotation        ?Knee flexion        ?Knee extension 4/5 4+ 4/5 4+  ?Ankle dorsiflexion 5/5  5/5   ?Ankle plantarflexion        ?Ankle inversion        ?Ankle  eversion        ? (Blank rows = not tested) ?  ? ?06-26-21 ?Ab marching x20 ?Dead bug 2 x10 ?Bridge 2 x10  ?Clamshell x 10 each  ?Sidelying hip abduction 2 x 10  ?Windshield wiper x10 with 5 second hold ?SKTC stretch 5 x 10" each  ? ? ?  ?  ?PATIENT EDUCATION:  ? ?On exercise form and function  ?  ?HOME EXERCISE PROGRAM: ?06/24/21 ?Access Code: [email or web address] ?URL: [email or web address] ?Date: 06/24/2021 ?Prepared by: Josue Hector ? ?Exercises ?- Side Plank on Knees  - 1-2 x daily - 7 x weekly - 1 sets - 3 reps - 15 second hold ?- Plank on Knees  - 1-2 x daily - 7 x weekly - 1 sets - 3 reps - 15 second hold ?- Standing Anti-Rotation Press with Anchored Resistance  - 1-2 x daily - 7 x weekly - 2 sets - 10 reps ?- Alternating Single Leg Bridge  - 1-2 x daily - 7 x weekly - 2 sets - 10 repsAccess Code: [email or web address] ?URL: [email or web address] ?Date: 06/26/2021 ?Prepared by: Josue Hector ? ?Exercises ?- Supine Hip Internal and External Rotation  - 2 x daily - 7 x weekly - 1 sets - 10 reps - 5-10 second hold ?- Supine Single Knee to Chest Stretch  - 2 x daily - 7 x weekly - 1 sets - 10 reps - 5-10 second hold ?- Supine March  - 2 x daily - 7 x weekly - 1 sets - 10 reps ?- Supine Dead Bug with Leg Extension  - 2 x daily - 7 x weekly - 2 sets - 10 reps ?- Supine Bridge  - 2 x daily - 7 x weekly - 2 sets - 10 reps ?- Sidelying Hip Abduction  - 2 x daily - 7 x weekly - 2 sets - 10 reps ?- Prone Hip Extension  - 2 x daily - 7 x weekly - 2 sets - 10 reps ?  ?ASSESSMENT: ?  ?CLINICAL IMPRESSION: ?Patient tolerated session well. Patient showing steady progress with core stabilization and able to perform progressions with no increased complaint of back pain. Patient did  note some RT shoulder/ arm discomfort with side planks. Instructed to discontinue if arm becomes more painful. Patient cued on proper form and body mechanics with there ex progressions today. Required verbal cues to avoid hip hiking during  planks for increased core muscle activity. Issued updated HEP handout. Patient will continue to benefit from skilled therapy services to reduce remaining deficits and improve functional ability.  ? ? ?  ?OBJECTIVE IMPAIRMENTS decreased activity tolerance, decreased mobility, decreased ROM, decreased strength, increased fascial restrictions, improper body mechanics, and pain.  ?  ?ACTIVITY LIMITATIONS cleaning, community activity, laundry, yard work, shopping, and yard work.  ?  ?PERSONAL FACTORS Age are also affecting patient's functional outcome.  ?  ?  ?REHAB POTENTIAL: Good ?  ?CLINICAL DECISION MAKING: Stable/uncomplicated ?  ?EVALUATION COMPLEXITY: Low ?  ?  ?GOALS: ?  ?SHORT TERM GOALS: Target date: 06/06/2021 ?  ?Patient will be independent with initial HEP and self-management strategies to improve functional outcomes ?Baseline:  ?Goal status: MET  ?  ?  ?LONG TERM GOALS: Target date: 06/20/2021 ?  ?Patient will be independent with advanced HEP and self-management strategies to improve functional outcomes ?Baseline:  ?Goal status: ongoing ?  ?2.  Patient will improve FOTO score to predicted value to indicate improvement in functional outcomes ?Baseline: 72% function  ?Goal status: Ongoing ?  ?3.  Patient will report reduction of back pain to 0/10 at rest for improved quality of life and ability to perform ADLs  ?Baseline: 5-6/10 at rest  ?Goal status:  Ongoing  ?  ?4. Patient will have equal to or > 4+/5 MMT throughout BLE to improve ability to perform functional mobility, stair ambulation and ADLs.  ?Baseline:  ?Goal status: MET ?  ?PLAN: ?PT FREQUENCY: 1-2x/week ?  ?PT DURATION: 4 weeks ?  ?PLANNED INTERVENTIONS: Therapeutic exercises, Therapeutic activity, Neuromuscular re-education, Balance training, Gait training, Patient/Family education, Joint manipulation, Joint mobilization, Stair training, Aquatic Therapy, Dry Needling, Electrical stimulation, Spinal manipulation, Spinal mobilization, Cryotherapy,  Moist heat, scar mobilization, Taping, Traction, Ultrasound, Biofeedback, Ionotophoresis 34m/ml Dexamethasone, and Manual therapy. ?  ?  ?PLAN FOR NEXT SESSION:  Green band resistance, lifting mechanics/ box

## 2021-06-26 ENCOUNTER — Encounter (HOSPITAL_COMMUNITY): Payer: Medicare PPO

## 2021-07-04 ENCOUNTER — Encounter (HOSPITAL_COMMUNITY): Payer: Self-pay | Admitting: Physical Therapy

## 2021-07-04 ENCOUNTER — Ambulatory Visit (HOSPITAL_COMMUNITY): Payer: Medicare PPO | Admitting: Physical Therapy

## 2021-07-04 DIAGNOSIS — R29898 Other symptoms and signs involving the musculoskeletal system: Secondary | ICD-10-CM | POA: Diagnosis not present

## 2021-07-04 DIAGNOSIS — M6281 Muscle weakness (generalized): Secondary | ICD-10-CM | POA: Diagnosis not present

## 2021-07-04 DIAGNOSIS — M545 Low back pain, unspecified: Secondary | ICD-10-CM

## 2021-07-04 NOTE — Therapy (Signed)
?OUTPATIENT PHYSICAL THERAPY TREATMENT NOTE ? ? ?Patient Name: Angelica Hicks ?MRN: [id number] ?DOB:1950/10/18, 71 y.o., female ?Today's Date: 07/04/2021 ? ? ? ?See note below for Objective Data and Assessment of Progress/Goals.  ? ?  ?PCP: Asencion Noble, MD ?REFERRING PROVIDER: Asencion Noble, MD ? ? PT End of Session - 07/04/21 1031   ? ? Visit Number 6   ? Number of Visits 8   ? Date for PT Re-Evaluation 07/15/21   ? Authorization Type [employer]   ? Authorization Time Period 4 visits 4/12-07/15/21   ? Authorization - Number of Visits 6   ? Progress Note Due on Visit 10   ? PT Start Time 1033   ? PT Stop Time 1120   ? PT Time Calculation (min) 47 min   ? Activity Tolerance Patient tolerated treatment well   ? Behavior During Therapy [facility] for tasks assessed/performed   ? ?  ?  ? ?  ? ? ?Past Medical History:  ?Diagnosis Date  ? Abnormal Pap smear of cervix   ? 12-03-15 neg HPV HR+, 16/18 neg  ? IBS (irritable bowel syndrome)   ? Osteopenia   ? ?Past Surgical History:  ?Procedure Laterality Date  ? BUNIONECTOMY    ? left & right  ? CATARACT EXTRACTION, BILATERAL    ? COLONOSCOPY N/A 05/11/2019  ? Procedure: COLONOSCOPY;  Surgeon: Rogene Houston, MD;  [facility];  Service: Endoscopy;  Laterality: N/A;  930  ? THYROIDECTOMY, PARTIAL  2001  ? ?Patient Active Problem List  ? Diagnosis Date Noted  ? Special screening for malignant neoplasms, colon 01/11/2019  ? Family hx of colon cancer 01/11/2019  ? ? ?REFERRING DIAG: low back pain ? ?THERAPY DIAG:  ?Low back pain, unspecified back pain laterality, unspecified chronicity, unspecified whether sciatica present ? ?PERTINENT HISTORY: osteopenia ? ?PRECAUTIONS: none ? ?SUBJECTIVE: "It was a rough night". Feels this may be due to posturing while working on a quilt. Exercises are going well. No issues there.  ? ?PAIN:  ?Are you having pain? Yes: NPRS scale: 4/10 ?Pain location: low back  ?Pain description: low back ?Aggravating factors: mornings ?Relieving factors:  hot shower, walking ? ? ? ?PCP: Asencion Noble, MD ?  ?REFERRING PROVIDER: Asencion Noble, MD ?  ?REFERRING DIAG:  LBP  ?  ?THERAPY DIAG:  ?Low back pain, unspecified back pain laterality, unspecified chronicity, unspecified whether sciatica present ?  ?ONSET DATE: Within last year  ?  ?  ?PATIENT GOALS Figure out what I can do to reduce pain ?  ?  ?OBJECTIVE:  ?  ?DIAGNOSTIC FINDINGS:  ?IMPRESSION: ?Degenerative change without acute abnormality. ?  ?  ?PATIENT SURVEYS:  ?FOTO 72% function ?  ?  ?COGNITION: ?          Overall cognitive status: Within functional limits for tasks assessed               ?           ?SENSATION: ?WFL ?  ?  ?PALPATION: ?No notable tenderness notes  ?  ?LUMBAR ROM:  ?  ?  ?LE MMT: ?  ?MMT Right ?05/23/2021 Left ?05/23/2021  ?Hip flexion 4+/5 4+/5  ?Hip extension 4/5 4/5  ?Hip abduction 4/5 4/5  ?Hip adduction      ?Hip internal rotation      ?Hip external rotation      ?Knee flexion      ?Knee extension 4/5 4/5  ?Ankle dorsiflexion 5/5 5/5  ?Ankle  plantarflexion      ?Ankle inversion      ?Ankle eversion      ? (Blank rows = not tested) ?  ?LUMBAR SPECIAL TESTS:  ?Straight leg raise test: Negative ?  ?  ?  ?GAIT: ?Distance walked: [facility] ?Assistive device utilized: None ?Level of assistance: Complete Independence ?  ?  ?  ?  ?TODAY'S TREATMENT  ?07/04/21 ?Deadbugs x15  ?Bridge with alternating knee extension x15 ?GTB resisted clamshell  x10 each  ?Palloff press GTB x 15 each ?Palloff walkout GTB x10 each  ?Squat with med ball hold 2 x 10 (green ball) ?Box lifts x10 (5lb) ?   ?06/24/21 ?Deadbugs 2 x10  ?Bridge 10 x5" ? Bridge with alternating knee extension x10  ? RTB resisted clamshell 2 x15 each  ? Plank series front/ side/ side 3 x 15" each  ? Palloff press RTB x 15 each ? ?  ?  ?PATIENT EDUCATION:  ? ?On exercise form and function  ?  ?HOME EXERCISE PROGRAM: ?07/04/21 ?Goblet squat ?Palloff walkout  ? ?06/24/21 ?Access Code: [email or web address] ?URL: [email or web address] ?Date: 06/24/2021 ?Prepared  by: Josue Hector ? ?Exercises ?- Side Plank on Knees  - 1-2 x daily - 7 x weekly - 1 sets - 3 reps - 15 second hold ?- Plank on Knees  - 1-2 x daily - 7 x weekly - 1 sets - 3 reps - 15 second hold ?- Standing Anti-Rotation Press with Anchored Resistance  - 1-2 x daily - 7 x weekly - 2 sets - 10 reps ?- Alternating Single Leg Bridge  - 1-2 x daily - 7 x weekly - 2 sets - 10 repsAccess Code: [email or web address] ?URL: [email or web address] ?Date: 06/10/2021 ?Prepared by: Josue Hector ? ?Exercises ?- Supine Hip Internal and External Rotation  - 2 x daily - 7 x weekly - 1 sets - 10 reps - 5-10 second hold ?- Supine Single Knee to Chest Stretch  - 2 x daily - 7 x weekly - 1 sets - 10 reps - 5-10 second hold ?- Supine March  - 2 x daily - 7 x weekly - 1 sets - 10 reps ?- Supine Dead Bug with Leg Extension  - 2 x daily - 7 x weekly - 2 sets - 10 reps ?- Supine Bridge  - 2 x daily - 7 x weekly - 2 sets - 10 reps ?- Sidelying Hip Abduction  - 2 x daily - 7 x weekly - 2 sets - 10 reps ?- Prone Hip Extension  - 2 x daily - 7 x weekly - 2 sets - 10 reps ?  ?ASSESSMENT: ?  ?CLINICAL IMPRESSION: ?Patient progressing well toward therapy goals. Able to increase band resistance during core strengthening activity with no increased pain. Added goblet squats with medicine ball holds, and educated Patient on proper lifting mechanics. Patient required verbal cues for proper body mechanics and avoiding excess anterior translation and spinal flexion while lifting from ground. Issued HEP handout for additional exercises. Patient will continue to benefit from skilled therapy services to reduce remaining deficits and improve functional ability.  ? ?  ?OBJECTIVE IMPAIRMENTS decreased activity tolerance, decreased mobility, decreased ROM, decreased strength, increased fascial restrictions, improper body mechanics, and pain.  ?  ?ACTIVITY LIMITATIONS cleaning, community activity, laundry, yard work, shopping, and yard work.  ?  ?PERSONAL  FACTORS Age are also affecting patient's functional outcome.  ?  ?  ?REHAB POTENTIAL: Good ?  ?CLINICAL DECISION MAKING: Stable/uncomplicated ?  ?EVALUATION COMPLEXITY: Low ?  ?  ?  GOALS: ?  ?SHORT TERM GOALS: Target date: 06/06/2021 ?  ?Patient will be independent with initial HEP and self-management strategies to improve functional outcomes ?Baseline:  ?Goal status: MET  ?  ?  ?LONG TERM GOALS: Target date: 06/20/2021 ?  ?Patient will be independent with advanced HEP and self-management strategies to improve functional outcomes ?Baseline:  ?Goal status: ongoing ?  ?2.  Patient will improve FOTO score to predicted value to indicate improvement in functional outcomes ?Baseline: 72% function  ?Goal status: Ongoing ?  ?3.  Patient will report reduction of back pain to 0/10 at rest for improved quality of life and ability to perform ADLs  ?Baseline: 5-6/10 at rest  ?Goal status:  Ongoing  ?  ?4. Patient will have equal to or > 4+/5 MMT throughout BLE to improve ability to perform functional mobility, stair ambulation and ADLs.  ?Baseline:  ?Goal status: MET ?  ?PLAN: ?PT FREQUENCY: 1-2x/week ?  ?PT DURATION: 4 weeks ?  ?PLANNED INTERVENTIONS: Therapeutic exercises, Therapeutic activity, Neuromuscular re-education, Balance training, Gait training, Patient/Family education, Joint manipulation, Joint mobilization, Stair training, Aquatic Therapy, Dry Needling, Electrical stimulation, Spinal manipulation, Spinal mobilization, Cryotherapy, Moist heat, scar mobilization, Taping, Traction, Ultrasound, Biofeedback, Ionotophoresis 22m/ml Dexamethasone, and Manual therapy. ?  ?  ?PLAN FOR NEXT SESSION:  Review HEP and lifting mechanics. Possibly blue band resistance. Machines? Answer questions. Possible DC if confident with home program.  ? ? ?11:28 AM, 07/04/21 ?CJosue HectorPT DPT  ?Physical Therapist with [place] [facility] ?[phone]? ? ?

## 2021-07-07 ENCOUNTER — Other Ambulatory Visit (HOSPITAL_COMMUNITY): Payer: Self-pay | Admitting: Internal Medicine

## 2021-07-07 ENCOUNTER — Ambulatory Visit (HOSPITAL_COMMUNITY)
Admission: RE | Admit: 2021-07-07 | Discharge: 2021-07-07 | Disposition: A | Discharge status: Home / Self Care | Payer: Medicare PPO | Source: Ambulatory Visit | Attending: Internal Medicine | Admitting: Internal Medicine

## 2021-07-07 DIAGNOSIS — M79621 Pain in right upper arm: Secondary | ICD-10-CM | POA: Diagnosis not present

## 2021-07-07 DIAGNOSIS — M25511 Pain in right shoulder: Secondary | ICD-10-CM | POA: Insufficient documentation

## 2021-07-09 ENCOUNTER — Encounter (HOSPITAL_COMMUNITY): Payer: Self-pay | Admitting: Physical Therapy

## 2021-07-09 ENCOUNTER — Ambulatory Visit (HOSPITAL_COMMUNITY): Payer: Medicare PPO | Attending: Internal Medicine | Admitting: Physical Therapy

## 2021-07-09 DIAGNOSIS — M6281 Muscle weakness (generalized): Secondary | ICD-10-CM | POA: Diagnosis not present

## 2021-07-09 DIAGNOSIS — M545 Low back pain, unspecified: Secondary | ICD-10-CM | POA: Diagnosis not present

## 2021-07-09 NOTE — Therapy (Signed)
?OUTPATIENT PHYSICAL THERAPY TREATMENT NOTE ? ? ?Patient Name: Angelica Hicks ?MRN: [id number] ?DOB:10-17-1950, 71 y.o., female ?Today's Date: 07/09/2021 ? ?PHYSICAL THERAPY DISCHARGE SUMMARY ? ?Visits from Start of Care: 7 ? ?Current functional level related to goals / functional outcomes: ?See below ?  ?Remaining deficits: ?See below ?  ?Education / Equipment: ?See below  ? ?Patient agrees to discharge. Patient goals were partially met. Patient is being discharged due to maximized rehab potential.  ? ? ?See note below for Objective Data and Assessment of Progress/Goals.  ? ?  ?PCP: Asencion Noble, MD ?REFERRING PROVIDER: Asencion Noble, MD ? ? PT End of Session - 07/09/21 0950   ? ? Visit Number 7   ? Number of Visits 8   ? Date for PT Re-Evaluation 07/15/21   ? Authorization Type [employer]   ? Authorization Time Period 4 visits 4/12-07/15/21   ? Authorization - Number of Visits 7   ? Progress Note Due on Visit 10   ? PT Start Time [phone]   ? PT Stop Time 1030   ? PT Time Calculation (min) 43 min   ? Activity Tolerance Patient tolerated treatment well   ? Behavior During Therapy [facility] for tasks assessed/performed   ? ?  ?  ? ?  ? ? ?Past Medical History:  ?Diagnosis Date  ? Abnormal Pap smear of cervix   ? 12-03-15 neg HPV HR+, 16/18 neg  ? IBS (irritable bowel syndrome)   ? Osteopenia   ? ?Past Surgical History:  ?Procedure Laterality Date  ? BUNIONECTOMY    ? left & right  ? CATARACT EXTRACTION, BILATERAL    ? COLONOSCOPY N/A 05/11/2019  ? Procedure: COLONOSCOPY;  Surgeon: Rogene Houston, MD;  [facility];  Service: Endoscopy;  Laterality: N/A;  930  ? THYROIDECTOMY, PARTIAL  2001  ? ?Patient Active Problem List  ? Diagnosis Date Noted  ? Special screening for malignant neoplasms, colon 01/11/2019  ? Family hx of colon cancer 01/11/2019  ? ? ?REFERRING DIAG: low back pain ? ?THERAPY DIAG:  ?Low back pain, unspecified back pain laterality, unspecified chronicity, unspecified whether sciatica  present ? ?PERTINENT HISTORY: osteopenia ? ?PRECAUTIONS: none ? ?SUBJECTIVE: Back is still hurting, about the same. Most pain at night and first thing in morning.  ? ?PAIN:  ?Are you having pain? Yes: NPRS scale: 5/10 ?Pain location: low back  ?Pain description: low back ?Aggravating factors: mornings ?Relieving factors: hot shower, walking ? ? ? ?PCP: Asencion Noble, MD ?  ?REFERRING PROVIDER: Asencion Noble, MD ?  ?REFERRING DIAG:  LBP  ?  ?THERAPY DIAG:  ?Low back pain, unspecified back pain laterality, unspecified chronicity, unspecified whether sciatica present ?  ?ONSET DATE: Within last year  ?  ?  ?PATIENT GOALS Figure out what I can do to reduce pain ?  ?  ?OBJECTIVE:  ?  ?DIAGNOSTIC FINDINGS:  ?IMPRESSION: ?Degenerative change without acute abnormality. ?  ?  ?PATIENT SURVEYS:  ?FOTO: 75% function  ?  ?  ?COGNITION: ?          Overall cognitive status: Within functional limits for tasks assessed               ?           ?SENSATION: ?WFL ?  ?  ?PALPATION: ?No notable tenderness notes  ?  ?LUMBAR ROM: WNL except 20% limit in extension and 10% limit in LT side bending with discomfort  ?  ?  ?LE  MMT: ?  ?MMT Right ?05/23/2021 Right ?07/09/2021 Left ?05/23/2021 Left ?07/09/2021  ?Hip flexion 4+/5 5 4+/5 5  ?Hip extension 4/5 4+ 4/5 4+  ?Hip abduction 4/5 4+ 4/5 4+  ?Hip adduction        ?Hip internal rotation         ?Hip external rotation        ?Knee flexion        ?Knee extension 4/5 5 4/5 5  ?Ankle dorsiflexion 5/5 5 5/5 5  ?Ankle plantarflexion        ?Ankle inversion        ?Ankle eversion        ? (Blank rows = not tested) ?  ?LUMBAR SPECIAL TESTS:  ?Straight leg raise test: Negative ?  ?  ?  ?GAIT: ?Distance walked: [facility] ?Assistive device utilized: None ?Level of assistance: Complete Independence ?  ?  ?  ?  ?TODAY'S TREATMENT  ?07/09/21 ?Reassess ? ?07/04/21 ?Deadbugs x15  ?Bridge with alternating knee extension x15 ?GTB resisted clamshell  x10 each  ?Palloff press GTB x 15 each ?Palloff walkout GTB x10 each  ?Squat  with med ball hold 2 x 10 (green ball) ?Box lifts x10 (5lb) ?   ?06/24/21 ?Deadbugs 2 x10  ?Bridge 10 x5" ? Bridge with alternating knee extension x10  ? RTB resisted clamshell 2 x15 each  ? Plank series front/ side/ side 3 x 15" each  ? Palloff press RTB x 15 each ? ?  ?  ?PATIENT EDUCATION:  ? ?On exercise form and function  ?  ?HOME EXERCISE PROGRAM: ?07/04/21 ?Goblet squat ?Palloff walkout  ? ?06/24/21 ?Access Code: [email or web address] ?URL: [email or web address] ?Date: 06/24/2021 ?Prepared by: Josue Hector ? ?Exercises ?- Side Plank on Knees  - 1-2 x daily - 7 x weekly - 1 sets - 3 reps - 15 second hold ?- Plank on Knees  - 1-2 x daily - 7 x weekly - 1 sets - 3 reps - 15 second hold ?- Standing Anti-Rotation Press with Anchored Resistance  - 1-2 x daily - 7 x weekly - 2 sets - 10 reps ?- Alternating Single Leg Bridge  - 1-2 x daily - 7 x weekly - 2 sets - 10 repsAccess Code: [email or web address] ?URL: [email or web address] ?Date: 06/10/2021 ?Prepared by: Josue Hector ? ?Exercises ?- Supine Hip Internal and External Rotation  - 2 x daily - 7 x weekly - 1 sets - 10 reps - 5-10 second hold ?- Supine Single Knee to Chest Stretch  - 2 x daily - 7 x weekly - 1 sets - 10 reps - 5-10 second hold ?- Supine March  - 2 x daily - 7 x weekly - 1 sets - 10 reps ?- Supine Dead Bug with Leg Extension  - 2 x daily - 7 x weekly - 2 sets - 10 reps ?- Supine Bridge  - 2 x daily - 7 x weekly - 2 sets - 10 reps ?- Sidelying Hip Abduction  - 2 x daily - 7 x weekly - 2 sets - 10 reps ?- Prone Hip Extension  - 2 x daily - 7 x weekly - 2 sets - 10 reps ?  ?ASSESSMENT: ?  ?CLINICAL IMPRESSION: ?Patient shows good objective improvements. She has very good strength, and fairly good ROM. Slight limitation in extension and left sidebending. She is still limited by mild to moderate pain despite objective improvements. Patient reports pain is worse and most noticeable at night time. Educated patient on monitoring of symptoms  and watch  for suspicious progression of symptoms and to report back to PCP for further assessment. Patient being DC today due to max benefit reached. Reviewed HEP and answered all patient questions. Encouraged patient to follow up with therapy services with any further questions or concerns.  ?  ?OBJECTIVE IMPAIRMENTS decreased activity tolerance, decreased mobility, decreased ROM, decreased strength, increased fascial restrictions, improper body mechanics, and pain.  ?  ?ACTIVITY LIMITATIONS cleaning, community activity, laundry, yard work, shopping, and yard work.  ?  ?PERSONAL FACTORS Age are also affecting patient's functional outcome.  ?  ?  ?REHAB POTENTIAL: Good ?  ?CLINICAL DECISION MAKING: Stable/uncomplicated ?  ?EVALUATION COMPLEXITY: Low ?  ?  ?GOALS: ?  ?SHORT TERM GOALS: Target date: 06/06/2021 ?  ?Patient will be independent with initial HEP and self-management strategies to improve functional outcomes ?Baseline:  ?Goal status: MET  ?  ?  ?LONG TERM GOALS: Target date: 06/20/2021 ?  ?Patient will be independent with advanced HEP and self-management strategies to improve functional outcomes ?Baseline: Reviewed and answered all questions  ?Goal status: MET ?  ?2.  Patient will improve FOTO score to predicted value to indicate improvement in functional outcomes ?Baseline: 75% function  ?Goal status: Partially MET (within 3 %) ?  ?3.  Patient will report reduction of back pain to 0/10 at rest for improved quality of life and ability to perform ADLs  ?Baseline: 5/10 at rest  ?Goal status:  Not MET ?  ?4. Patient will have equal to or > 4+/5 MMT throughout BLE to improve ability to perform functional mobility, stair ambulation and ADLs.  ?Baseline:  ?Goal status: MET ?  ?PLAN: ?PT FREQUENCY: 1-2x/week ?  ?PT DURATION: 4 weeks ?  ?PLANNED INTERVENTIONS: Therapeutic exercises, Therapeutic activity, Neuromuscular re-education, Balance training, Gait training, Patient/Family education, Joint manipulation, Joint  mobilization, Stair training, Aquatic Therapy, Dry Needling, Electrical stimulation, Spinal manipulation, Spinal mobilization, Cryotherapy, Moist heat, scar mobilization, Taping, Traction, Ultrasound, Biofeedback, Ionotophoresis 4

## 2021-07-15 ENCOUNTER — Encounter (HOSPITAL_COMMUNITY): Payer: Medicare PPO | Admitting: Physical Therapy

## 2021-07-24 ENCOUNTER — Other Ambulatory Visit (HOSPITAL_COMMUNITY): Payer: Self-pay | Admitting: Sports Medicine

## 2021-07-24 DIAGNOSIS — M542 Cervicalgia: Secondary | ICD-10-CM | POA: Diagnosis not present

## 2021-07-24 DIAGNOSIS — M545 Low back pain, unspecified: Secondary | ICD-10-CM

## 2021-07-24 DIAGNOSIS — M7581 Other shoulder lesions, right shoulder: Secondary | ICD-10-CM | POA: Diagnosis not present

## 2021-08-08 ENCOUNTER — Ambulatory Visit (HOSPITAL_COMMUNITY)
Admission: RE | Admit: 2021-08-08 | Discharge: 2021-08-08 | Disposition: A | Discharge status: Home / Self Care | Payer: Medicare PPO | Source: Ambulatory Visit | Attending: Sports Medicine | Admitting: Sports Medicine

## 2021-08-08 DIAGNOSIS — M2578 Osteophyte, vertebrae: Secondary | ICD-10-CM | POA: Diagnosis not present

## 2021-08-08 DIAGNOSIS — M545 Low back pain, unspecified: Secondary | ICD-10-CM | POA: Diagnosis not present

## 2021-08-08 DIAGNOSIS — M5126 Other intervertebral disc displacement, lumbar region: Secondary | ICD-10-CM | POA: Diagnosis not present

## 2021-08-08 DIAGNOSIS — M48061 Spinal stenosis, lumbar region without neurogenic claudication: Secondary | ICD-10-CM | POA: Diagnosis not present

## 2021-08-15 DIAGNOSIS — M545 Low back pain, unspecified: Secondary | ICD-10-CM | POA: Diagnosis not present

## 2021-10-28 DIAGNOSIS — B002 Herpesviral gingivostomatitis and pharyngotonsillitis: Secondary | ICD-10-CM | POA: Diagnosis not present

## 2021-10-28 DIAGNOSIS — L570 Actinic keratosis: Secondary | ICD-10-CM | POA: Diagnosis not present

## 2021-10-28 DIAGNOSIS — L814 Other melanin hyperpigmentation: Secondary | ICD-10-CM | POA: Diagnosis not present

## 2021-10-28 DIAGNOSIS — L821 Other seborrheic keratosis: Secondary | ICD-10-CM | POA: Diagnosis not present

## 2021-10-28 DIAGNOSIS — B078 Other viral warts: Secondary | ICD-10-CM | POA: Diagnosis not present

## 2021-10-28 DIAGNOSIS — L820 Inflamed seborrheic keratosis: Secondary | ICD-10-CM | POA: Diagnosis not present

## 2021-10-28 DIAGNOSIS — D1801 Hemangioma of skin and subcutaneous tissue: Secondary | ICD-10-CM | POA: Diagnosis not present

## 2021-10-28 DIAGNOSIS — L918 Other hypertrophic disorders of the skin: Secondary | ICD-10-CM | POA: Diagnosis not present

## 2021-11-05 DIAGNOSIS — Z961 Presence of intraocular lens: Secondary | ICD-10-CM | POA: Diagnosis not present

## 2021-11-05 DIAGNOSIS — H43813 Vitreous degeneration, bilateral: Secondary | ICD-10-CM | POA: Diagnosis not present

## 2021-11-20 ENCOUNTER — Other Ambulatory Visit (HOSPITAL_COMMUNITY): Payer: Self-pay | Admitting: Internal Medicine

## 2021-11-20 ENCOUNTER — Other Ambulatory Visit: Payer: Self-pay | Admitting: *Deleted

## 2021-11-20 DIAGNOSIS — Z1231 Encounter for screening mammogram for malignant neoplasm of breast: Secondary | ICD-10-CM

## 2021-11-20 NOTE — Telephone Encounter (Signed)
Electronic refill request received from [facility]  Med refill request: Vitamin E vaginal suppositories  Last AEX: 02/24/21 -TW Next AEX: Not scheduled  Last MMG (if hormonal med) N/A  Last Rx sent 01/22/20 #36/3RF  Refill authorized: Tiffany, please review request and advise on refill.

## 2021-11-24 MED ORDER — NONFORMULARY OR COMPOUNDED ITEM: 0 refills | Status: DC

## 2021-12-02 DIAGNOSIS — M810 Age-related osteoporosis without current pathological fracture: Secondary | ICD-10-CM | POA: Diagnosis not present

## 2021-12-02 DIAGNOSIS — N1831 Chronic kidney disease, stage 3a: Secondary | ICD-10-CM | POA: Diagnosis not present

## 2021-12-02 DIAGNOSIS — Z8639 Personal history of other endocrine, nutritional and metabolic disease: Secondary | ICD-10-CM | POA: Diagnosis not present

## 2021-12-02 DIAGNOSIS — K580 Irritable bowel syndrome with diarrhea: Secondary | ICD-10-CM | POA: Diagnosis not present

## 2021-12-02 DIAGNOSIS — Z79899 Other long term (current) drug therapy: Secondary | ICD-10-CM | POA: Diagnosis not present

## 2021-12-05 ENCOUNTER — Other Ambulatory Visit (HOSPITAL_COMMUNITY): Payer: Self-pay | Admitting: Internal Medicine

## 2021-12-05 DIAGNOSIS — M810 Age-related osteoporosis without current pathological fracture: Secondary | ICD-10-CM | POA: Diagnosis not present

## 2021-12-05 DIAGNOSIS — N1831 Chronic kidney disease, stage 3a: Secondary | ICD-10-CM | POA: Diagnosis not present

## 2021-12-05 DIAGNOSIS — K589 Irritable bowel syndrome without diarrhea: Secondary | ICD-10-CM | POA: Diagnosis not present

## 2021-12-05 DIAGNOSIS — U071 COVID-19: Secondary | ICD-10-CM | POA: Diagnosis not present

## 2021-12-05 DIAGNOSIS — Z23 Encounter for immunization: Secondary | ICD-10-CM | POA: Diagnosis not present

## 2021-12-05 DIAGNOSIS — Z681 Body mass index (BMI) 19 or less, adult: Secondary | ICD-10-CM | POA: Diagnosis not present

## 2021-12-05 DIAGNOSIS — Z0000 Encounter for general adult medical examination without abnormal findings: Secondary | ICD-10-CM | POA: Diagnosis not present

## 2021-12-26 ENCOUNTER — Ambulatory Visit (HOSPITAL_COMMUNITY): Payer: Medicare PPO

## 2021-12-29 ENCOUNTER — Ambulatory Visit (HOSPITAL_COMMUNITY)
Admission: RE | Admit: 2021-12-29 | Discharge: 2021-12-29 | Disposition: A | Discharge status: Home / Self Care | Payer: Medicare PPO | Source: Ambulatory Visit | Attending: Internal Medicine | Admitting: Internal Medicine

## 2021-12-29 DIAGNOSIS — E213 Hyperparathyroidism, unspecified: Secondary | ICD-10-CM | POA: Diagnosis not present

## 2021-12-29 DIAGNOSIS — Z1231 Encounter for screening mammogram for malignant neoplasm of breast: Secondary | ICD-10-CM | POA: Diagnosis not present

## 2021-12-29 DIAGNOSIS — Z780 Asymptomatic menopausal state: Secondary | ICD-10-CM | POA: Insufficient documentation

## 2021-12-29 DIAGNOSIS — Z13820 Encounter for screening for osteoporosis: Secondary | ICD-10-CM | POA: Diagnosis not present

## 2021-12-29 DIAGNOSIS — M810 Age-related osteoporosis without current pathological fracture: Secondary | ICD-10-CM | POA: Diagnosis not present

## 2022-02-03 ENCOUNTER — Encounter: Payer: Self-pay | Admitting: Nurse Practitioner

## 2022-02-09 ENCOUNTER — Encounter: Payer: Self-pay | Admitting: Nurse Practitioner

## 2022-02-09 ENCOUNTER — Ambulatory Visit: Payer: Medicare PPO | Admitting: Nurse Practitioner

## 2022-02-09 VITALS — BP 122/78 | HR 95 | Ht 61.25 in | Wt 110.0 lb

## 2022-02-09 DIAGNOSIS — N952 Postmenopausal atrophic vaginitis: Secondary | ICD-10-CM

## 2022-02-09 MED ORDER — NONFORMULARY OR COMPOUNDED ITEM: 3 refills | Status: DC

## 2022-02-09 NOTE — Progress Notes (Signed)
   Angelica Hicks 31-Dec-1950 [id number]   History:  71 y.o. [id number] presents for medication management. Postmenopausal - No HRT, no bleeding. Using Vitamin E suppositories for vaginal atrophy 3x weekly. She has not been as consistent due to travel and notices a difference. Wants to continue. 2017 normal cytology positive HR HPV negative 16/18/45, normal in 2018 and 2019.  On Fosamax since 12/2019 for osteoporosis, managed by PCP.   Gynecologic History Patient's last menstrual period was 01/07/1998.   Contraception: post menopausal status Sexually active: Yes  Health Maintenance Last Pap: 02/24/2021. Results were: Normal, 3-year repeat Last mammogram: 12/29/2021. Results were: Normal Last colonoscopy: 05/11/2019. Results were: Normal, 10-year recall Last Dexa: 12/29/2021. Results were: T-score -2.5 in right forearm, osteopenic in all other sites  Past medical history, past surgical history, family history and social history were all reviewed and documented in the EPIC chart. Married. 2 children, one in [place], one in [place]. 5 grandchildren ages 26-15. 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   ROS:  A ROS was performed and pertinent positives and negatives are included.  Exam:  Vitals:   02/09/22 0857  BP: 122/78  Pulse: 95  SpO2: 97%  Weight: 110 lb (49.9 kg)  Height: 5' 1.25" (1.556 m)    Body mass index is 20.61 kg/m.  General appearance:  Normal  Assessment/Plan:  71 y.o. [id number] for medication management.  Atrophic vaginitis - Plan: NONFORMULARY OR COMPOUNDED ITEM - Vitamin E suppositories 200 u/ml 3x per week. Doing well on this and would like to continue.   Return in 1 year for breast and pelvic exam.     Olivia Mackie DNP, 9:11 AM 02/09/2022

## 2022-05-14 DIAGNOSIS — M9901 Segmental and somatic dysfunction of cervical region: Secondary | ICD-10-CM | POA: Diagnosis not present

## 2022-05-14 DIAGNOSIS — M9903 Segmental and somatic dysfunction of lumbar region: Secondary | ICD-10-CM | POA: Diagnosis not present

## 2022-05-14 DIAGNOSIS — M5136 Other intervertebral disc degeneration, lumbar region: Secondary | ICD-10-CM | POA: Diagnosis not present

## 2022-05-14 DIAGNOSIS — M5033 Other cervical disc degeneration, cervicothoracic region: Secondary | ICD-10-CM | POA: Diagnosis not present

## 2022-05-18 DIAGNOSIS — M5136 Other intervertebral disc degeneration, lumbar region: Secondary | ICD-10-CM | POA: Diagnosis not present

## 2022-05-18 DIAGNOSIS — M5033 Other cervical disc degeneration, cervicothoracic region: Secondary | ICD-10-CM | POA: Diagnosis not present

## 2022-05-18 DIAGNOSIS — M9903 Segmental and somatic dysfunction of lumbar region: Secondary | ICD-10-CM | POA: Diagnosis not present

## 2022-05-18 DIAGNOSIS — M9901 Segmental and somatic dysfunction of cervical region: Secondary | ICD-10-CM | POA: Diagnosis not present

## 2022-05-21 DIAGNOSIS — M5136 Other intervertebral disc degeneration, lumbar region: Secondary | ICD-10-CM | POA: Diagnosis not present

## 2022-05-21 DIAGNOSIS — M5033 Other cervical disc degeneration, cervicothoracic region: Secondary | ICD-10-CM | POA: Diagnosis not present

## 2022-05-21 DIAGNOSIS — M9901 Segmental and somatic dysfunction of cervical region: Secondary | ICD-10-CM | POA: Diagnosis not present

## 2022-05-21 DIAGNOSIS — M9903 Segmental and somatic dysfunction of lumbar region: Secondary | ICD-10-CM | POA: Diagnosis not present

## 2022-05-28 DIAGNOSIS — M5033 Other cervical disc degeneration, cervicothoracic region: Secondary | ICD-10-CM | POA: Diagnosis not present

## 2022-05-28 DIAGNOSIS — M9903 Segmental and somatic dysfunction of lumbar region: Secondary | ICD-10-CM | POA: Diagnosis not present

## 2022-05-28 DIAGNOSIS — M5136 Other intervertebral disc degeneration, lumbar region: Secondary | ICD-10-CM | POA: Diagnosis not present

## 2022-05-28 DIAGNOSIS — M9901 Segmental and somatic dysfunction of cervical region: Secondary | ICD-10-CM | POA: Diagnosis not present

## 2022-06-03 DIAGNOSIS — M5033 Other cervical disc degeneration, cervicothoracic region: Secondary | ICD-10-CM | POA: Diagnosis not present

## 2022-06-03 DIAGNOSIS — M5136 Other intervertebral disc degeneration, lumbar region: Secondary | ICD-10-CM | POA: Diagnosis not present

## 2022-06-03 DIAGNOSIS — M9903 Segmental and somatic dysfunction of lumbar region: Secondary | ICD-10-CM | POA: Diagnosis not present

## 2022-06-03 DIAGNOSIS — M9901 Segmental and somatic dysfunction of cervical region: Secondary | ICD-10-CM | POA: Diagnosis not present

## 2022-06-04 DIAGNOSIS — M9903 Segmental and somatic dysfunction of lumbar region: Secondary | ICD-10-CM | POA: Diagnosis not present

## 2022-06-04 DIAGNOSIS — M9901 Segmental and somatic dysfunction of cervical region: Secondary | ICD-10-CM | POA: Diagnosis not present

## 2022-06-04 DIAGNOSIS — M5136 Other intervertebral disc degeneration, lumbar region: Secondary | ICD-10-CM | POA: Diagnosis not present

## 2022-06-04 DIAGNOSIS — M5033 Other cervical disc degeneration, cervicothoracic region: Secondary | ICD-10-CM | POA: Diagnosis not present

## 2022-06-08 DIAGNOSIS — M5136 Other intervertebral disc degeneration, lumbar region: Secondary | ICD-10-CM | POA: Diagnosis not present

## 2022-06-08 DIAGNOSIS — M9903 Segmental and somatic dysfunction of lumbar region: Secondary | ICD-10-CM | POA: Diagnosis not present

## 2022-06-08 DIAGNOSIS — M5033 Other cervical disc degeneration, cervicothoracic region: Secondary | ICD-10-CM | POA: Diagnosis not present

## 2022-06-08 DIAGNOSIS — M9901 Segmental and somatic dysfunction of cervical region: Secondary | ICD-10-CM | POA: Diagnosis not present

## 2022-06-10 DIAGNOSIS — M5136 Other intervertebral disc degeneration, lumbar region: Secondary | ICD-10-CM | POA: Diagnosis not present

## 2022-06-10 DIAGNOSIS — M9901 Segmental and somatic dysfunction of cervical region: Secondary | ICD-10-CM | POA: Diagnosis not present

## 2022-06-10 DIAGNOSIS — M9903 Segmental and somatic dysfunction of lumbar region: Secondary | ICD-10-CM | POA: Diagnosis not present

## 2022-06-10 DIAGNOSIS — M5033 Other cervical disc degeneration, cervicothoracic region: Secondary | ICD-10-CM | POA: Diagnosis not present

## 2022-06-11 DIAGNOSIS — M5136 Other intervertebral disc degeneration, lumbar region: Secondary | ICD-10-CM | POA: Diagnosis not present

## 2022-06-11 DIAGNOSIS — M9901 Segmental and somatic dysfunction of cervical region: Secondary | ICD-10-CM | POA: Diagnosis not present

## 2022-06-11 DIAGNOSIS — M5033 Other cervical disc degeneration, cervicothoracic region: Secondary | ICD-10-CM | POA: Diagnosis not present

## 2022-06-11 DIAGNOSIS — M9903 Segmental and somatic dysfunction of lumbar region: Secondary | ICD-10-CM | POA: Diagnosis not present

## 2022-06-18 DIAGNOSIS — M5136 Other intervertebral disc degeneration, lumbar region: Secondary | ICD-10-CM | POA: Diagnosis not present

## 2022-06-18 DIAGNOSIS — M9903 Segmental and somatic dysfunction of lumbar region: Secondary | ICD-10-CM | POA: Diagnosis not present

## 2022-06-18 DIAGNOSIS — M9901 Segmental and somatic dysfunction of cervical region: Secondary | ICD-10-CM | POA: Diagnosis not present

## 2022-06-18 DIAGNOSIS — M5033 Other cervical disc degeneration, cervicothoracic region: Secondary | ICD-10-CM | POA: Diagnosis not present

## 2022-06-22 DIAGNOSIS — M5136 Other intervertebral disc degeneration, lumbar region: Secondary | ICD-10-CM | POA: Diagnosis not present

## 2022-06-22 DIAGNOSIS — M9903 Segmental and somatic dysfunction of lumbar region: Secondary | ICD-10-CM | POA: Diagnosis not present

## 2022-06-22 DIAGNOSIS — M9901 Segmental and somatic dysfunction of cervical region: Secondary | ICD-10-CM | POA: Diagnosis not present

## 2022-06-22 DIAGNOSIS — M5033 Other cervical disc degeneration, cervicothoracic region: Secondary | ICD-10-CM | POA: Diagnosis not present

## 2022-11-11 DIAGNOSIS — Z961 Presence of intraocular lens: Secondary | ICD-10-CM | POA: Diagnosis not present

## 2022-11-11 DIAGNOSIS — H43813 Vitreous degeneration, bilateral: Secondary | ICD-10-CM | POA: Diagnosis not present

## 2022-12-01 ENCOUNTER — Other Ambulatory Visit (HOSPITAL_COMMUNITY): Payer: Self-pay | Admitting: Internal Medicine

## 2022-12-01 DIAGNOSIS — Z1231 Encounter for screening mammogram for malignant neoplasm of breast: Secondary | ICD-10-CM

## 2022-12-04 DIAGNOSIS — E210 Primary hyperparathyroidism: Secondary | ICD-10-CM | POA: Diagnosis not present

## 2022-12-04 DIAGNOSIS — Z79899 Other long term (current) drug therapy: Secondary | ICD-10-CM | POA: Diagnosis not present

## 2022-12-04 DIAGNOSIS — N1831 Chronic kidney disease, stage 3a: Secondary | ICD-10-CM | POA: Diagnosis not present

## 2022-12-04 DIAGNOSIS — M810 Age-related osteoporosis without current pathological fracture: Secondary | ICD-10-CM | POA: Diagnosis not present

## 2022-12-04 DIAGNOSIS — N200 Calculus of kidney: Secondary | ICD-10-CM | POA: Diagnosis not present

## 2022-12-04 DIAGNOSIS — K580 Irritable bowel syndrome with diarrhea: Secondary | ICD-10-CM | POA: Diagnosis not present

## 2022-12-11 ENCOUNTER — Other Ambulatory Visit (HOSPITAL_COMMUNITY): Payer: Self-pay | Admitting: Internal Medicine

## 2022-12-11 DIAGNOSIS — N200 Calculus of kidney: Secondary | ICD-10-CM | POA: Diagnosis not present

## 2022-12-11 DIAGNOSIS — Z0001 Encounter for general adult medical examination with abnormal findings: Secondary | ICD-10-CM | POA: Diagnosis not present

## 2022-12-11 DIAGNOSIS — K580 Irritable bowel syndrome with diarrhea: Secondary | ICD-10-CM | POA: Diagnosis not present

## 2022-12-11 DIAGNOSIS — N1339 Other hydronephrosis: Secondary | ICD-10-CM | POA: Diagnosis not present

## 2022-12-11 DIAGNOSIS — Z0000 Encounter for general adult medical examination without abnormal findings: Secondary | ICD-10-CM | POA: Diagnosis not present

## 2022-12-11 DIAGNOSIS — R7989 Other specified abnormal findings of blood chemistry: Secondary | ICD-10-CM

## 2022-12-11 DIAGNOSIS — Z23 Encounter for immunization: Secondary | ICD-10-CM | POA: Diagnosis not present

## 2022-12-11 DIAGNOSIS — M818 Other osteoporosis without current pathological fracture: Secondary | ICD-10-CM | POA: Diagnosis not present

## 2022-12-16 ENCOUNTER — Ambulatory Visit (HOSPITAL_COMMUNITY)
Admission: RE | Admit: 2022-12-16 | Discharge: 2022-12-16 | Disposition: A | Discharge status: Home / Self Care | Payer: Medicare PPO | Source: Ambulatory Visit | Attending: Internal Medicine | Admitting: Internal Medicine

## 2022-12-16 DIAGNOSIS — N200 Calculus of kidney: Secondary | ICD-10-CM | POA: Diagnosis not present

## 2022-12-16 DIAGNOSIS — R7989 Other specified abnormal findings of blood chemistry: Secondary | ICD-10-CM | POA: Diagnosis not present

## 2022-12-24 DIAGNOSIS — L570 Actinic keratosis: Secondary | ICD-10-CM | POA: Diagnosis not present

## 2022-12-24 DIAGNOSIS — L814 Other melanin hyperpigmentation: Secondary | ICD-10-CM | POA: Diagnosis not present

## 2022-12-24 DIAGNOSIS — D2261 Melanocytic nevi of right upper limb, including shoulder: Secondary | ICD-10-CM | POA: Diagnosis not present

## 2022-12-24 DIAGNOSIS — L821 Other seborrheic keratosis: Secondary | ICD-10-CM | POA: Diagnosis not present

## 2022-12-24 DIAGNOSIS — L578 Other skin changes due to chronic exposure to nonionizing radiation: Secondary | ICD-10-CM | POA: Diagnosis not present

## 2022-12-24 DIAGNOSIS — L84 Corns and callosities: Secondary | ICD-10-CM | POA: Diagnosis not present

## 2022-12-24 DIAGNOSIS — D485 Neoplasm of uncertain behavior of skin: Secondary | ICD-10-CM | POA: Diagnosis not present

## 2022-12-24 DIAGNOSIS — C44622 Squamous cell carcinoma of skin of right upper limb, including shoulder: Secondary | ICD-10-CM | POA: Diagnosis not present

## 2023-01-04 ENCOUNTER — Encounter (HOSPITAL_COMMUNITY): Payer: Self-pay

## 2023-01-04 ENCOUNTER — Ambulatory Visit (HOSPITAL_COMMUNITY)
Admission: RE | Admit: 2023-01-04 | Discharge: 2023-01-04 | Disposition: A | Discharge status: Home / Self Care | Payer: Medicare PPO | Source: Ambulatory Visit | Attending: Internal Medicine | Admitting: Internal Medicine

## 2023-01-04 DIAGNOSIS — Z1231 Encounter for screening mammogram for malignant neoplasm of breast: Secondary | ICD-10-CM | POA: Diagnosis not present

## 2023-03-24 ENCOUNTER — Ambulatory Visit (INDEPENDENT_AMBULATORY_CARE_PROVIDER_SITE_OTHER): Payer: Medicare PPO | Admitting: Nurse Practitioner

## 2023-03-24 VITALS — BP 128/74 | HR 87 | Ht 61.0 in | Wt 111.2 lb

## 2023-03-24 DIAGNOSIS — M810 Age-related osteoporosis without current pathological fracture: Secondary | ICD-10-CM

## 2023-03-24 DIAGNOSIS — N952 Postmenopausal atrophic vaginitis: Secondary | ICD-10-CM | POA: Diagnosis not present

## 2023-03-24 DIAGNOSIS — Z01419 Encounter for gynecological examination (general) (routine) without abnormal findings: Secondary | ICD-10-CM | POA: Diagnosis not present

## 2023-03-24 DIAGNOSIS — Z780 Asymptomatic menopausal state: Secondary | ICD-10-CM

## 2023-03-24 MED ORDER — NONFORMULARY OR COMPOUNDED ITEM: 3 refills | Status: DC

## 2023-03-24 NOTE — Progress Notes (Signed)
 Angelica Hicks January 13, 1951 [id number]   History:  73 y.o. [id number] presents for breast and pelvic exam. Postmenopausal - No HRT, no bleeding. Using Vitamin E suppositories for vaginal atrophy 3x weekly. 2017 normal cytology positive HR HPV negative 16/18/45. On Fosamax since 12/2019 for osteoporosis, managed by PCP.   Gynecologic History Patient's last menstrual period was 01/07/1998.   Contraception: post menopausal status Sexually active: Yes  Health Maintenance Last Pap: 02/24/2021. Results were: Normal Last mammogram: 01/04/2023. Results were: Normal Last colonoscopy: 05/11/2019. Results were: Normal, 10-year recall Last Dexa: 12/29/2021. Results were: T-score -2.5  Past medical history, past surgical history, family history and social history were all reviewed and documented in the EPIC chart. Married. 2 children, one in [place], one in [place]. 5 grandchildren ages 52-16. Retired Human resources officer.   ROS:  A ROS was performed and pertinent positives and negatives are included.  Exam:  Vitals:   03/24/23 1026  BP: 128/74  Pulse: 87  SpO2: 100%  Weight: 111 lb 3.2 oz (50.4 kg)  Height: 5\' 1"  (1.549 m)    Body mass index is 21.01 kg/m.  General appearance:  Normal Thyroid:  Symmetrical, normal in size, without palpable masses or nodularity. Respiratory  Auscultation:  Clear without wheezing or rhonchi Cardiovascular  Auscultation:  Regular rate, without rubs, murmurs or gallops  Edema/varicosities:  Not grossly evident Abdominal  Soft,nontender, without masses, guarding or rebound.  Liver/spleen:  No organomegaly noted  Hernia:  None appreciated  Skin  Inspection:  Grossly normal Breasts: Examined lying and sitting.   Right: Without masses, retractions, nipple discharge or axillary adenopathy.   Left: Without masses, retractions, nipple discharge or axillary adenopathy. Pelvic: External genitalia:  no lesions              Urethra:  normal appearing urethra  with no masses, tenderness or lesions              Bartholins and Skenes: normal                 Vagina: normal appearing vagina with normal color and discharge, no lesions. Atrophic changes              Cervix: no lesions Bimanual Exam:  Uterus:  no masses or tenderness              Adnexa: no mass, fullness, tenderness              Rectovaginal: Deferred              Anus:  normal, no lesions  Patient informed chaperone available to be present for breast and pelvic exam. Patient has requested no chaperone to be present. Patient has been advised what will be completed during breast and pelvic exam.   Assessment/Plan:  73 y.o. [id number] for breast and pelvic exam.   Well female exam with routine gynecological exam - Education provided on SBEs, importance of preventative screenings, current guidelines, high calcium diet, regular exercise, and multivitamin daily.  Labs with PCP.  Postmenopausal - no HRT, no bleeding.   Age-related osteoporosis without current pathological fracture - 12/2021 T-score -2.5. On Fosamax since 12/2019, managed by PCP.   Atrophic vaginitis - Using Vitamin E suppositories 3x per week. Still having some discomfort. Discussed vaginal estrogen and she is considering. Wants to finish current Vit E prescription then possibly switch to estrogen.   Screening for cervical cancer - 2017 normal cytology positive HR HPV negative 16/18/45. Will repeat at 3-year interval per  guidelines.   Screening for breast cancer - Normal mammogram history.  Continue annual screenings.  Normal breast exam today.  Screening for colon cancer - Normal colonoscopy in 2021. Will repeat at 10-year interval per GI's recommendation.   Return in about 2 years (around 03/23/2025) for B&P.       Andee Bamberger DNP, 10:37 AM 03/24/2023

## 2023-03-25 ENCOUNTER — Other Ambulatory Visit: Payer: Self-pay

## 2023-03-25 DIAGNOSIS — N952 Postmenopausal atrophic vaginitis: Secondary | ICD-10-CM

## 2023-03-25 NOTE — Telephone Encounter (Signed)
Opened in error

## 2023-04-01 ENCOUNTER — Telehealth: Payer: Self-pay

## 2023-04-01 NOTE — Telephone Encounter (Signed)
May need to be called in to pharmacy.

## 2023-04-01 NOTE — Telephone Encounter (Signed)
Pt left VM stating she needed her Vitamin E refilled. Pt had appt with TW 03/24/23 and the vitamin E was refilled to [facility]. LVM to pt to inform her and inquire more. Routing to provider while waiting for response

## 2023-04-02 ENCOUNTER — Telehealth: Payer: Self-pay

## 2023-04-02 NOTE — Telephone Encounter (Signed)
error

## 2023-04-02 NOTE — Telephone Encounter (Signed)
Spoke to [facility]. Vitamin E was not phoned in day of appt and ordering 03/24/23. Phoned in today 04/02/23. They will reach out to pt to let her know when it is ready for pick up.

## 2023-07-01 ENCOUNTER — Other Ambulatory Visit (HOSPITAL_COMMUNITY): Payer: Self-pay | Admitting: Internal Medicine

## 2023-07-01 ENCOUNTER — Ambulatory Visit (HOSPITAL_COMMUNITY)
Admission: RE | Admit: 2023-07-01 | Discharge: 2023-07-01 | Disposition: A | Discharge status: Home / Self Care | Source: Ambulatory Visit | Attending: Internal Medicine | Admitting: Internal Medicine

## 2023-07-01 DIAGNOSIS — M25531 Pain in right wrist: Secondary | ICD-10-CM

## 2023-07-01 DIAGNOSIS — M79644 Pain in right finger(s): Secondary | ICD-10-CM

## 2023-07-01 DIAGNOSIS — M19041 Primary osteoarthritis, right hand: Secondary | ICD-10-CM | POA: Diagnosis not present

## 2023-07-01 DIAGNOSIS — M79645 Pain in left finger(s): Secondary | ICD-10-CM

## 2023-07-01 DIAGNOSIS — M25532 Pain in left wrist: Secondary | ICD-10-CM | POA: Diagnosis not present

## 2023-07-01 DIAGNOSIS — M1812 Unilateral primary osteoarthritis of first carpometacarpal joint, left hand: Secondary | ICD-10-CM | POA: Diagnosis not present

## 2023-07-01 DIAGNOSIS — M720 Palmar fascial fibromatosis [Dupuytren]: Secondary | ICD-10-CM | POA: Diagnosis not present

## 2023-07-06 ENCOUNTER — Other Ambulatory Visit (HOSPITAL_COMMUNITY): Payer: Self-pay | Admitting: Internal Medicine

## 2023-07-06 DIAGNOSIS — M899 Disorder of bone, unspecified: Secondary | ICD-10-CM

## 2023-07-08 ENCOUNTER — Ambulatory Visit (HOSPITAL_COMMUNITY)

## 2023-07-13 ENCOUNTER — Encounter (HOSPITAL_COMMUNITY): Payer: Self-pay | Admitting: Internal Medicine

## 2023-07-15 ENCOUNTER — Ambulatory Visit (HOSPITAL_COMMUNITY)
Admission: RE | Admit: 2023-07-15 | Discharge: 2023-07-15 | Disposition: A | Discharge status: Home / Self Care | Source: Ambulatory Visit | Attending: Internal Medicine | Admitting: Internal Medicine

## 2023-07-15 DIAGNOSIS — M899 Disorder of bone, unspecified: Secondary | ICD-10-CM | POA: Insufficient documentation

## 2023-07-15 DIAGNOSIS — S63591A Other specified sprain of right wrist, initial encounter: Secondary | ICD-10-CM | POA: Diagnosis not present

## 2023-07-15 DIAGNOSIS — M25431 Effusion, right wrist: Secondary | ICD-10-CM | POA: Diagnosis not present

## 2023-07-15 DIAGNOSIS — M65941 Unspecified synovitis and tenosynovitis, right hand: Secondary | ICD-10-CM | POA: Diagnosis not present

## 2023-07-15 DIAGNOSIS — M19031 Primary osteoarthritis, right wrist: Secondary | ICD-10-CM | POA: Diagnosis not present

## 2023-07-15 MED ORDER — GADOBUTROL 1 MMOL/ML IV SOLN
5.0000 mL | Freq: Once | INTRAVENOUS | Status: AC | PRN
  Administered 2023-07-15: 5 mL via INTRAVENOUS

## 2023-08-23 DIAGNOSIS — M19031 Primary osteoarthritis, right wrist: Secondary | ICD-10-CM | POA: Diagnosis not present

## 2023-08-23 DIAGNOSIS — M65931 Unspecified synovitis and tenosynovitis, right forearm: Secondary | ICD-10-CM | POA: Diagnosis not present

## 2023-08-23 DIAGNOSIS — M720 Palmar fascial fibromatosis [Dupuytren]: Secondary | ICD-10-CM | POA: Diagnosis not present

## 2023-08-23 DIAGNOSIS — M180 Bilateral primary osteoarthritis of first carpometacarpal joints: Secondary | ICD-10-CM | POA: Diagnosis not present

## 2023-08-31 DIAGNOSIS — M898X9 Other specified disorders of bone, unspecified site: Secondary | ICD-10-CM | POA: Diagnosis not present

## 2023-08-31 DIAGNOSIS — M259 Joint disorder, unspecified: Secondary | ICD-10-CM | POA: Diagnosis not present

## 2023-08-31 DIAGNOSIS — R2231 Localized swelling, mass and lump, right upper limb: Secondary | ICD-10-CM | POA: Diagnosis not present

## 2023-09-13 DIAGNOSIS — M19031 Primary osteoarthritis, right wrist: Secondary | ICD-10-CM | POA: Diagnosis not present

## 2023-09-13 DIAGNOSIS — M180 Bilateral primary osteoarthritis of first carpometacarpal joints: Secondary | ICD-10-CM | POA: Diagnosis not present

## 2023-10-26 ENCOUNTER — Other Ambulatory Visit: Payer: Self-pay | Admitting: Orthopedic Surgery

## 2023-10-26 DIAGNOSIS — G8918 Other acute postprocedural pain: Secondary | ICD-10-CM | POA: Diagnosis not present

## 2023-10-26 DIAGNOSIS — M859 Disorder of bone density and structure, unspecified: Secondary | ICD-10-CM | POA: Diagnosis not present

## 2023-10-26 DIAGNOSIS — M6598 Unspecified synovitis and tenosynovitis, other site: Secondary | ICD-10-CM | POA: Diagnosis not present

## 2023-10-26 DIAGNOSIS — M67833 Other specified disorders of tendon, right wrist: Secondary | ICD-10-CM | POA: Diagnosis not present

## 2023-10-26 DIAGNOSIS — R2231 Localized swelling, mass and lump, right upper limb: Secondary | ICD-10-CM | POA: Diagnosis not present

## 2023-10-29 LAB — SURGICAL PATHOLOGY

## 2023-11-10 DIAGNOSIS — M25631 Stiffness of right wrist, not elsewhere classified: Secondary | ICD-10-CM | POA: Diagnosis not present

## 2023-11-17 DIAGNOSIS — Z961 Presence of intraocular lens: Secondary | ICD-10-CM | POA: Diagnosis not present

## 2023-11-17 DIAGNOSIS — H43813 Vitreous degeneration, bilateral: Secondary | ICD-10-CM | POA: Diagnosis not present

## 2023-11-18 DIAGNOSIS — M25631 Stiffness of right wrist, not elsewhere classified: Secondary | ICD-10-CM | POA: Diagnosis not present

## 2023-11-22 ENCOUNTER — Encounter (HOSPITAL_COMMUNITY): Payer: Self-pay | Admitting: Internal Medicine

## 2023-11-22 ENCOUNTER — Other Ambulatory Visit (HOSPITAL_COMMUNITY): Payer: Self-pay | Admitting: Internal Medicine

## 2023-11-22 DIAGNOSIS — Z1231 Encounter for screening mammogram for malignant neoplasm of breast: Secondary | ICD-10-CM

## 2023-11-25 DIAGNOSIS — M25631 Stiffness of right wrist, not elsewhere classified: Secondary | ICD-10-CM | POA: Diagnosis not present

## 2023-12-09 DIAGNOSIS — M25631 Stiffness of right wrist, not elsewhere classified: Secondary | ICD-10-CM | POA: Diagnosis not present

## 2023-12-14 DIAGNOSIS — N1831 Chronic kidney disease, stage 3a: Secondary | ICD-10-CM | POA: Diagnosis not present

## 2023-12-14 DIAGNOSIS — N200 Calculus of kidney: Secondary | ICD-10-CM | POA: Diagnosis not present

## 2023-12-14 DIAGNOSIS — M810 Age-related osteoporosis without current pathological fracture: Secondary | ICD-10-CM | POA: Diagnosis not present

## 2023-12-14 DIAGNOSIS — E210 Primary hyperparathyroidism: Secondary | ICD-10-CM | POA: Diagnosis not present

## 2023-12-14 DIAGNOSIS — Z79899 Other long term (current) drug therapy: Secondary | ICD-10-CM | POA: Diagnosis not present

## 2023-12-14 DIAGNOSIS — K580 Irritable bowel syndrome with diarrhea: Secondary | ICD-10-CM | POA: Diagnosis not present

## 2023-12-21 DIAGNOSIS — N183 Chronic kidney disease, stage 3 unspecified: Secondary | ICD-10-CM | POA: Diagnosis not present

## 2023-12-21 DIAGNOSIS — Z0001 Encounter for general adult medical examination with abnormal findings: Secondary | ICD-10-CM | POA: Diagnosis not present

## 2023-12-21 DIAGNOSIS — Z23 Encounter for immunization: Secondary | ICD-10-CM | POA: Diagnosis not present

## 2023-12-21 DIAGNOSIS — N200 Calculus of kidney: Secondary | ICD-10-CM | POA: Diagnosis not present

## 2023-12-21 DIAGNOSIS — M8000XA Age-related osteoporosis with current pathological fracture, unspecified site, initial encounter for fracture: Secondary | ICD-10-CM | POA: Diagnosis not present

## 2023-12-21 DIAGNOSIS — M545 Low back pain, unspecified: Secondary | ICD-10-CM | POA: Diagnosis not present

## 2023-12-21 DIAGNOSIS — Z48810 Encounter for surgical aftercare following surgery on the sense organs: Secondary | ICD-10-CM | POA: Diagnosis not present

## 2023-12-21 DIAGNOSIS — K582 Mixed irritable bowel syndrome: Secondary | ICD-10-CM | POA: Diagnosis not present

## 2023-12-23 DIAGNOSIS — M25631 Stiffness of right wrist, not elsewhere classified: Secondary | ICD-10-CM | POA: Diagnosis not present

## 2024-01-07 ENCOUNTER — Ambulatory Visit (HOSPITAL_COMMUNITY)
Admission: RE | Admit: 2024-01-07 | Discharge: 2024-01-07 | Disposition: A | Discharge status: Home / Self Care | Source: Ambulatory Visit | Attending: Internal Medicine | Admitting: Internal Medicine

## 2024-01-07 ENCOUNTER — Encounter (HOSPITAL_COMMUNITY): Payer: Self-pay

## 2024-01-07 DIAGNOSIS — Z1231 Encounter for screening mammogram for malignant neoplasm of breast: Secondary | ICD-10-CM | POA: Diagnosis not present

## 2024-01-25 DIAGNOSIS — L820 Inflamed seborrheic keratosis: Secondary | ICD-10-CM | POA: Diagnosis not present

## 2024-01-25 DIAGNOSIS — L821 Other seborrheic keratosis: Secondary | ICD-10-CM | POA: Diagnosis not present

## 2024-01-25 DIAGNOSIS — D225 Melanocytic nevi of trunk: Secondary | ICD-10-CM | POA: Diagnosis not present

## 2024-01-25 DIAGNOSIS — L570 Actinic keratosis: Secondary | ICD-10-CM | POA: Diagnosis not present

## 2024-01-25 DIAGNOSIS — D2272 Melanocytic nevi of left lower limb, including hip: Secondary | ICD-10-CM | POA: Diagnosis not present

## 2024-01-25 DIAGNOSIS — D2271 Melanocytic nevi of right lower limb, including hip: Secondary | ICD-10-CM | POA: Diagnosis not present

## 2024-01-25 DIAGNOSIS — L814 Other melanin hyperpigmentation: Secondary | ICD-10-CM | POA: Diagnosis not present

## 2024-01-25 DIAGNOSIS — Z85828 Personal history of other malignant neoplasm of skin: Secondary | ICD-10-CM | POA: Diagnosis not present

## 2024-01-30 ENCOUNTER — Ambulatory Visit
Admission: EM | Admit: 2024-01-30 | Discharge: 2024-01-30 | Disposition: A | Discharge status: Home / Self Care | Attending: Nurse Practitioner | Admitting: Nurse Practitioner

## 2024-01-30 DIAGNOSIS — J029 Acute pharyngitis, unspecified: Secondary | ICD-10-CM

## 2024-01-30 DIAGNOSIS — J069 Acute upper respiratory infection, unspecified: Secondary | ICD-10-CM

## 2024-01-30 LAB — POC COVID19/FLU A&B COMBO
Covid Antigen, POC: NEGATIVE
Influenza A Antigen, POC: NEGATIVE
Influenza B Antigen, POC: NEGATIVE

## 2024-01-30 LAB — POCT RAPID STREP A (OFFICE): Rapid Strep A Screen: NEGATIVE

## 2024-01-30 MED ORDER — PROMETHAZINE-DM 6.25-15 MG/5ML PO SYRP: 5.0000 mL | ORAL_SOLUTION | Freq: Every evening | ORAL | 0 refills | Status: DC | PRN

## 2024-01-30 MED ORDER — AZELASTINE HCL 0.1 % NA SOLN: 2.0000 | Freq: Two times a day (BID) | NASAL | 0 refills | Status: DC

## 2024-01-30 NOTE — ED Triage Notes (Signed)
 Pt reports she has a cough, headache, throat pain, left side ear pain x 4 days   Took mucinex and tylenol

## 2024-01-30 NOTE — ED Provider Notes (Signed)
 [facility]    CSN: [id number] Arrival date & time: 01/30/24  0808      History   Chief Complaint No chief complaint on file.   HPI Angelica Hicks is a 73 y.o. female.   The history is provided by the patient.   Patient presents with a 4-day history of headache, nasal congestion, runny nose, sore throat, left ear pain, and cough.  Patient states left ear pain worsened 1 day ago.  She also states that she experienced hoarseness, that has since improved.  She denies fever, chills, ear drainage, wheezing, difficulty breathing, abdominal pain, nausea, vomiting, diarrhea, or rash.  States that her daughter was diagnosed with pneumonia over the past week or so.  She states she has been taking Mucinex and Tylenol for her symptoms.  Past Medical History:  Diagnosis Date   Abnormal Pap smear of cervix    12-03-15 neg HPV HR+, 16/18 neg   IBS (irritable bowel syndrome)    Osteopenia     Patient Active Problem List   Diagnosis Date Noted   Special screening for malignant neoplasms, colon 01/11/2019   Family hx of colon cancer 01/11/2019    Past Surgical History:  Procedure Laterality Date   BUNIONECTOMY     left & right   CATARACT EXTRACTION, BILATERAL     COLONOSCOPY N/A 05/11/2019   Procedure: COLONOSCOPY;  Surgeon: Golda Claudis PENNER, MD;  Location: [facility];  Service: Endoscopy;  Laterality: N/A;  930   THYROIDECTOMY, PARTIAL  2001    OB History     Gravida  4   Para  2   Term  2   Preterm      AB  2   Living  2      SAB  2   IAB      Ectopic      Multiple      Live Births  2            Home Medications    Prior to Admission medications   Medication Sig Start Date End Date Taking? Authorizing Provider  alendronate (FOSAMAX) 70 MG tablet  12/25/19   [provider]  Glycerin-Hypromellose-PEG 400 (DRY EYE RELIEF DROPS) 0.2-0.2-1 % SOLN Place 1 drop into both eyes daily as needed (Dry eyes).    [provider]   Methylcellulose, Laxative, (CITRUCEL PO) Take 2 tablets by mouth daily.     [provider]  Multiple Vitamins-Minerals (MULTIVITAMIN PO) Take 1 tablet by mouth daily. Centrum Silver    [provider]  NONFORMULARY OR COMPOUNDED ITEM Vitamin E vaginal suppositories 200u/ml. 3x weekly 03/24/23   Prentiss Annabella LABOR, NP    Family History Family History  Problem Relation Age of Onset   Cancer Father        prostate   Parkinson's disease Father    Breast cancer Paternal Aunt    Colon cancer Paternal Aunt    Other Brother        blood clot   Colon cancer Other     Social History Social History   Tobacco Use   Smoking status: Never   Smokeless tobacco: Never  Vaping Use   Vaping status: Never Used  Substance Use Topics   Alcohol use: Yes    Alcohol/week: 1.0 standard drink of alcohol    Types: 1 Standard drinks or equivalent per week    Comment: Occas   Drug use: No     Allergies  Shellfish allergy   Review of Systems Review of Systems Per HPI  Physical Exam Triage Vital Signs ED Triage Vitals  Encounter Vitals Group     BP 01/30/24 0847 122/84     Girls Systolic BP Percentile --      Girls Diastolic BP Percentile --      Boys Systolic BP Percentile --      Boys Diastolic BP Percentile --      Pulse Rate 01/30/24 0847 80     Resp 01/30/24 0847 18     Temp 01/30/24 0847 98.1 F (36.7 C)     Temp Source 01/30/24 0847 Oral     SpO2 01/30/24 0847 98 %     Weight --      Height --      Head Circumference --      Peak Flow --      Pain Score 01/30/24 0846 3     Pain Loc --      Pain Education --      Exclude from Growth Chart --    No data found.  Updated Vital Signs BP 122/84 (BP Location: Right Arm)   Pulse 80   Temp 98.1 F (36.7 C) (Oral)   Resp 18   LMP 01/07/1998   SpO2 98%   Visual Acuity Right Eye Distance:   Left Eye Distance:   Bilateral Distance:    Right Eye Near:   Left Eye Near:    Bilateral Near:      Physical Exam Vitals and nursing note reviewed.  Constitutional:      General: She is not in acute distress.    Appearance: Normal appearance. She is well-developed.  HENT:     Head: Normocephalic and atraumatic.     Right Ear: Tympanic membrane, ear canal and external ear normal.     Left Ear: Tympanic membrane, ear canal and external ear normal.     Nose: Congestion present.     Right Turbinates: Enlarged and swollen.     Left Turbinates: Enlarged and swollen.     Right Sinus: No maxillary sinus tenderness or frontal sinus tenderness.     Left Sinus: No maxillary sinus tenderness or frontal sinus tenderness.     Mouth/Throat:     Lips: Pink.     Mouth: Mucous membranes are moist.     Pharynx: Uvula midline. Posterior oropharyngeal erythema and postnasal drip present. No pharyngeal swelling, oropharyngeal exudate or uvula swelling.     Comments: Cobblestoning present to posterior oropharynx  Eyes:     Extraocular Movements: Extraocular movements intact.     Conjunctiva/sclera: Conjunctivae normal.     Pupils: Pupils are equal, round, and reactive to light.  Neck:     Thyroid: No thyromegaly.     Trachea: No tracheal deviation.  Cardiovascular:     Rate and Rhythm: Normal rate and regular rhythm.     Pulses: Normal pulses.     Heart sounds: Normal heart sounds.  Pulmonary:     Effort: Pulmonary effort is normal. No respiratory distress.     Breath sounds: Normal breath sounds. No stridor. No wheezing, rhonchi or rales.  Abdominal:     General: Bowel sounds are normal.     Palpations: Abdomen is soft.     Tenderness: There is no abdominal tenderness.  Musculoskeletal:     Cervical back: Normal range of motion and neck supple.  Skin:    General: Skin is warm and dry.  Neurological:  General: No focal deficit present.     Mental Status: She is alert and oriented to person, place, and time.  Psychiatric:        Mood and Affect: Mood normal.        Behavior: Behavior  normal.        Thought Content: Thought content normal.        Judgment: Judgment normal.      UC Treatments / Results  Labs (all labs ordered are listed, but only abnormal results are displayed) Labs Reviewed  POCT RAPID STREP A (OFFICE)  POC COVID19/FLU A&B COMBO    EKG   Radiology No results found.  Procedures Procedures (including critical care time)  Medications Ordered in UC Medications - No data to display  Initial Impression / Assessment and Plan / UC Course  I have reviewed the triage vital signs and the nursing notes.  Pertinent labs & imaging results that were available during my care of the patient were reviewed by me and considered in my medical decision making (see chart for details).  The rapid strep test and COVID/flu test were negative.  The patient is well-appearing, is in no acute distress, vital signs are stable.  Symptoms consistent with viral etiology.  Will provide symptomatic treatment with azelastine  nasal spray for nasal congestion and runny nose and Promethazine  DM for the cough.  Patient advised that she can continue Mucinex during the daytime as directed.  Supportive care recommendations were provided and discussed with the patient to include fluids, rest, over-the-counter Tylenol, use of normal saline nasal spray, and use of a humidifier at nighttime during sleep.  Discussed indications with patient regarding follow-up.  Patient was in agreement with this plan of care and verbalizes understanding.  All questions were answered.  Patient stable for discharge.  Final Clinical Impressions(s) / UC Diagnoses   Final diagnoses:  None   Discharge Instructions   None    ED Prescriptions   None    PDMP not reviewed this encounter.   Gilmer Etta PARAS, NP 01/30/24 [phone]

## 2024-01-30 NOTE — Discharge Instructions (Addendum)
 The rapid strep test and COVID/flu test were negative. Take medication as prescribed. Increase fluids and allow for plenty of rest. You may take over-the-counter Tylenol as needed for pain, fever, or general discomfort. Recommend the use of normal saline nasal spray throughout the day for nasal congestion or runny nose. For your throat pain, recommend warm salt water  gargles 3-4 times daily as needed.  You may also use over-the-counter Chloraseptic throat spray or throat lozenges. For the cough, recommend use of a humidifier in your bedroom at nighttime during sleep and to sleep elevated on pillows while symptoms persist. Symptoms should begin to improve over the next 5 to 7 days.  If symptoms fail to improve, or begin to worsen, you may follow-up in this clinic or with your primary care physician for further evaluation. Follow-up as needed.

## 2024-03-23 ENCOUNTER — Ambulatory Visit: Payer: Medicare PPO | Admitting: Nurse Practitioner

## 2024-03-23 ENCOUNTER — Encounter: Payer: Self-pay | Admitting: Nurse Practitioner

## 2024-03-23 VITALS — BP 118/78 | HR 85 | Ht 61.0 in | Wt 112.0 lb

## 2024-03-23 DIAGNOSIS — N951 Menopausal and female climacteric states: Secondary | ICD-10-CM | POA: Diagnosis not present

## 2024-03-23 DIAGNOSIS — N952 Postmenopausal atrophic vaginitis: Secondary | ICD-10-CM | POA: Diagnosis not present

## 2024-03-23 MED ORDER — ESTRADIOL 0.01 % VA CREA: 1.0000 g | TOPICAL_CREAM | Freq: Every day | VAGINAL | 2 refills | Status: AC

## 2024-03-23 NOTE — Progress Notes (Signed)
" ° °  Angelica Hicks 11-Sep-1950 [id number]   History:  74 y.o. H5E7977 presents for medication management. Using compounded Vit E vaginal suppositories for dryness and atrophy. Also has pain with intercourse at times. Helps some but has not been very consistent because it is refrigerated and easy to forget. Has considered switching to vaginal estrogen.   Past medical history, past surgical history, family history and social history were all reviewed and documented in the EPIC chart.  ROS:  A ROS was performed and pertinent positives and negatives are included.  Exam:  Vitals:   03/23/24 1047  BP: 118/78  Pulse: 85  SpO2: 99%  Weight: 112 lb (50.8 kg)  Height: 5' 1 (1.549 m)   Body mass index is 21.16 kg/m.  General appearance: Normal  Assessment/Plan:  74 y.o. H5E7977 for medication management.   Vaginal atrophy - Plan: estradiol  (ESTRACE ) 0.01 % CREA vaginal cream twice weekly. Will use nightly first 2 weeks.   Menopausal vaginal dryness - Plan: estradiol  (ESTRACE ) 0.01 % CREA vaginal cream twice weekly.   Return in about 1 year (around 03/23/2025) for B&P.     Angelica DELENA Shutter DNP, 11:05 AM 03/23/2024  "

## 2024-04-07 ENCOUNTER — Telehealth: Payer: Self-pay | Admitting: *Deleted

## 2024-04-07 NOTE — Telephone Encounter (Signed)
 Spoke with patient. Patient states she started vaginal estrogen cream 2 weeks ago, has been using nightly as instructed. Patient reports increasing bilateral nipple tenderness and swelling since starting vaginal estrogen.   Denies nipple discharge, fever/chills, lumps, vaginal discharge or bleeding.   Advised this could be a side effect from the initial dosing of vaginal estrogen, reduce to twice weekly as previously instructed by Annabella, return call to office if symptoms do not improve or resolve, or if new symptoms develop.   Advised patient Annabella is out of the office, will forward message for her to review and f/u with additional recommendations. Patient agreeable.  Routing to [employer] for review.

## 2024-04-11 NOTE — Telephone Encounter (Signed)
 Spoke with patient. Advised per TW. Patient reports symptoms are improving. Patient aware to call if symptoms do not resolve or new symptoms develop.   Encounter closed.

## 2025-03-27 ENCOUNTER — Encounter: Admitting: Nurse Practitioner
